# Patient Record
Sex: Male | Born: 1980 | Race: Black or African American | Hispanic: No | Marital: Single | Smoking: Former smoker
Health system: Southern US, Community
[De-identification: ages and names within clinical notes are randomized; demographics above are authoritative.]

## PROBLEM LIST (undated history)

## (undated) DIAGNOSIS — I10 Essential (primary) hypertension: Secondary | ICD-10-CM

## (undated) DIAGNOSIS — R519 Headache, unspecified: Secondary | ICD-10-CM

## (undated) DIAGNOSIS — K219 Gastro-esophageal reflux disease without esophagitis: Secondary | ICD-10-CM

## (undated) DIAGNOSIS — I509 Heart failure, unspecified: Secondary | ICD-10-CM

## (undated) HISTORY — PX: PILONIDAL CYST EXCISION: SHX744

## (undated) HISTORY — DX: Headache, unspecified: R51.9

## (undated) HISTORY — DX: Essential (primary) hypertension: I10

## (undated) HISTORY — DX: Heart failure, unspecified: I50.9

## (undated) HISTORY — PX: KNEE ARTHROSCOPY: SUR90

## (undated) HISTORY — PX: ORIF FINGER / THUMB FRACTURE: SUR932

## (undated) HISTORY — PX: SHOULDER ARTHROSCOPY WITH LABRAL REPAIR: SHX5691

## (undated) HISTORY — DX: Gastro-esophageal reflux disease without esophagitis: K21.9

## (undated) HISTORY — PX: ACHILLES TENDON SURGERY: SHX542

---

## 2015-12-08 ENCOUNTER — Encounter: Payer: Self-pay | Admitting: Urgent Care

## 2015-12-08 ENCOUNTER — Emergency Department
Admission: EM | Admit: 2015-12-08 | Discharge: 2015-12-09 | Disposition: A | Discharge status: Home / Self Care | Payer: 59 | Attending: Emergency Medicine | Admitting: Emergency Medicine

## 2015-12-08 DIAGNOSIS — Y9389 Activity, other specified: Secondary | ICD-10-CM | POA: Insufficient documentation

## 2015-12-08 DIAGNOSIS — Y998 Other external cause status: Secondary | ICD-10-CM | POA: Diagnosis not present

## 2015-12-08 DIAGNOSIS — T781XXA Other adverse food reactions, not elsewhere classified, initial encounter: Secondary | ICD-10-CM | POA: Diagnosis not present

## 2015-12-08 DIAGNOSIS — T7840XA Allergy, unspecified, initial encounter: Secondary | ICD-10-CM

## 2015-12-08 DIAGNOSIS — Y9289 Other specified places as the place of occurrence of the external cause: Secondary | ICD-10-CM | POA: Insufficient documentation

## 2015-12-08 DIAGNOSIS — X58XXXA Exposure to other specified factors, initial encounter: Secondary | ICD-10-CM | POA: Insufficient documentation

## 2015-12-08 DIAGNOSIS — R1310 Dysphagia, unspecified: Secondary | ICD-10-CM | POA: Diagnosis not present

## 2015-12-08 DIAGNOSIS — R0989 Other specified symptoms and signs involving the circulatory and respiratory systems: Secondary | ICD-10-CM | POA: Diagnosis not present

## 2015-12-08 MED ORDER — FAMOTIDINE IN NACL 20-0.9 MG/50ML-% IV SOLN
20.0000 mg | Freq: Once | INTRAVENOUS | Status: AC
  Administered 2015-12-08: 20 mg via INTRAVENOUS
  Filled 2015-12-08: qty 50

## 2015-12-08 MED ORDER — DIPHENHYDRAMINE HCL 50 MG/ML IJ SOLN
50.0000 mg | Freq: Once | INTRAMUSCULAR | Status: AC
  Administered 2015-12-08: 50 mg via INTRAVENOUS
  Filled 2015-12-08: qty 1

## 2015-12-08 MED ORDER — EPINEPHRINE 0.3 MG/0.3ML IJ SOAJ: 0.3000 mg | Freq: Once | INTRAMUSCULAR | Status: DC

## 2015-12-08 MED ORDER — FAMOTIDINE 20 MG PO TABS: 20.0000 mg | ORAL_TABLET | Freq: Two times a day (BID) | ORAL | Status: DC

## 2015-12-08 MED ORDER — METHYLPREDNISOLONE SODIUM SUCC 125 MG IJ SOLR
125.0000 mg | Freq: Once | INTRAMUSCULAR | Status: AC
  Administered 2015-12-08: 125 mg via INTRAVENOUS
  Filled 2015-12-08: qty 2

## 2015-12-08 MED ORDER — PREDNISONE 20 MG PO TABS: 60.0000 mg | ORAL_TABLET | Freq: Every day | ORAL | Status: DC

## 2015-12-08 NOTE — ED Notes (Signed)
Patient presents with reports of  Fullness in his throat s/p eating crab cakes. Patient reports that he feels a "little bit" SOB. NAD noted in triage.

## 2015-12-08 NOTE — Discharge Instructions (Signed)
°  Return to the emergency room if you feel worse in any way. If you have a life-threatening allergic reaction use the EpiPen. Then call 911. Take the medications as prescribed. Follow up with an allergist. Do not eat seafood or anything else to which you are actually allergic.

## 2015-12-08 NOTE — ED Notes (Signed)
Pt reports eating crab cakes 30 minutes ago and since then has had issue with swallowing - Denies shortness of breath - Pt reports sharp pain in sternum - No rash present

## 2015-12-08 NOTE — ED Provider Notes (Addendum)
[facility] Emergency Department Provider Note  ____________________________________________   I have reviewed the triage vital signs and the nursing notes.   HISTORY  Chief Complaint Allergic Reaction    HPI Kevin Morris is a 35 y.o. male with a history of seafood allergy. He states that sometimes he has allergic reactions and sometimes he doesn't sore just tried out every once in a while. He states that he had crab cakes shortly before coming in and noticed a slight tightness to his throat. No difficulty swallowing or breathing or talking. No hives. To me he denies shortness of breath. Patient has no tongue or lip swelling. No angioedema. Just a sensation that it is slightly difficult to swallow.  History reviewed. No pertinent past medical history.  There are no active problems to display for this patient.   No past surgical history on file.  No current outpatient prescriptions on file.  Allergies Review of patient's allergies indicates no known allergies.  No family history on file.  Social History Social History  Substance Use Topics  . Smoking status: Never Smoker   . Smokeless tobacco: None  . Alcohol Use: Yes    Review of Systems Constitutional: No fever/chills Eyes: No visual changes. ENT: No sore throat. No stiff neck no neck pain Cardiovascular: Denies chest pain. Respiratory: Denies shortness of breath. Gastrointestinal:   no vomiting.  No diarrhea.  No constipation. Genitourinary: Negative for dysuria. Musculoskeletal: Negative lower extremity swelling Skin: Negative for rash. Neurological: Negative for headaches, focal weakness or numbness. 10-point ROS otherwise negative.  ____________________________________________   PHYSICAL EXAM:  VITAL SIGNS: ED Triage Vitals  Enc Vitals Group     BP 12/08/15 2158 185/100 mmHg     Pulse Rate 12/08/15 2156 80     Resp 12/08/15 2156 18     Temp 12/08/15 2156 98.2 F (36.8 C)      Temp Source 12/08/15 2156 Oral     SpO2 12/08/15 2156 97 %     Weight 12/08/15 2156 300 lb (136.079 kg)     Height 12/08/15 2156 6\' 4"  (1.93 m)     Head Cir --      Peak Flow --      Pain Score 12/08/15 2157 0     Pain Loc --      Pain Edu? --      Excl. in GC? --     Constitutional: Alert and oriented. Well appearing and in no acute distress. Eyes: Conjunctivae are normal. PERRL. EOMI. Head: Atraumatic. Nose: No congestion/rhinnorhea. Mouth/Throat: Mucous membranes are moist.  Oropharynx non-erythematous. Neck: No stridor.   Nontender with no meningismus Cardiovascular: Normal rate, regular rhythm. Grossly normal heart sounds.  Good peripheral circulation. Respiratory: Normal respiratory effort.  No retractions. Lungs CTAB. Abdominal: Soft and nontender. No distention. No guarding no rebound Back:  There is no focal tenderness or step off there is no midline tenderness there are no lesions noted. there is no CVA tenderness Musculoskeletal: No lower extremity tenderness. No joint effusions, no DVT signs strong distal pulses no edema Neurologic:  Normal speech and language. No gross focal neurologic deficits are appreciated.  Skin:  Skin is warm, dry and intact. No rash noted. Psychiatric: Mood and affect are normal. Speech and behavior are normal.  ____________________________________________   LABS (all labs ordered are listed, but only abnormal results are displayed)  Labs Reviewed - No data to display ____________________________________________  EKG  I personally interpreted any EKGs ordered by me or triage  ____________________________________________  RADIOLOGY  I reviewed any imaging ordered by me or triage that were performed during my shift ____________________________________________   PROCEDURES  Procedure(s) performed: None  Critical Care performed: None  ____________________________________________   INITIAL IMPRESSION / ASSESSMENT AND PLAN /  ED COURSE  Pertinent labs & imaging results that were available during my care of the patient were reviewed by me and considered in my medical decision making (see chart for details).  Patient very well-appearing, or face is clear with no evidence of angioedema. However he does have some subjective symptoms after eating something to which she knows he is allergic. No evidence of anaphylaxis. We will give him our usual course of medication do not think EpiPen is required right now and we will observe him closely.Marland Kitchen  ----------------------------------------- 11:52 PM on 12/08/2015 -----------------------------------------  he states his symptoms are better. His physical exam remains unremarkable with no evidence of anaphylaxis. Blood pressures down to the 140s as he relaxes. He was quite anxious when he came in. We will observe him in the department for a total of 3 hours and if symptoms do not worsen we will discharge him sign out to Dr. York Cerise at the end of my shift. ____________________________________________   FINAL CLINICAL IMPRESSION(S) / ED DIAGNOSES  Final diagnoses:  None      This chart was dictated using voice recognition software.  Despite best efforts to proofread,  errors can occur which can change meaning.     Jeanmarie Plant, MD 12/08/15 2219  Jeanmarie Plant, MD 12/08/15 [phone]

## 2015-12-09 NOTE — ED Provider Notes (Signed)
-----------------------------------------   12:00 AM on 12/09/2015 -----------------------------------------   Blood pressure 151/72, pulse 72, temperature 98.2 F (36.8 C), temperature source Oral, resp. rate 20, height 6\' 4"  (1.93 m), weight 136.079 kg, SpO2 98 %.  Assuming care from Dr. Alphonzo LemmingsMcShane.  In short, Kevin Morris is a 35 y.o. male with a chief complaint of Allergic Reaction .  Refer to the original H&P for additional details.  The current plan of care is to reassess.  ----------------------------------------- 1:02 AM on 12/09/2015 -----------------------------------------  Patient states he is ready to go.  I advised him that we usually a lot of people longer than that after an allergic reaction but he is ready to leave.  Since he did Not meet criteria for anaphylaxis I think this is acceptable, and Dr. Alphonzo LemmingsMcShane felt that the patient could go when he ready.  I discharged him with Dr. Iline OvenMcShane's prescriptions and discharge instructions.  Loleta Roseory Nicoli Nardozzi, MD 12/09/15 [phone]

## 2021-08-29 ENCOUNTER — Inpatient Hospital Stay
Admission: EM | Admit: 2021-08-29 | Discharge: 2021-08-31 | DRG: 291 | Disposition: A | Discharge status: Home / Self Care | Payer: Managed Care, Other (non HMO) | Attending: Internal Medicine | Admitting: Internal Medicine

## 2021-08-29 ENCOUNTER — Emergency Department: Payer: Managed Care, Other (non HMO)

## 2021-08-29 ENCOUNTER — Other Ambulatory Visit: Payer: Self-pay

## 2021-08-29 ENCOUNTER — Encounter: Payer: Self-pay | Admitting: Emergency Medicine

## 2021-08-29 ENCOUNTER — Ambulatory Visit
Admission: EM | Admit: 2021-08-29 | Discharge: 2021-08-29 | Disposition: A | Discharge status: Home / Self Care | Payer: Managed Care, Other (non HMO)

## 2021-08-29 DIAGNOSIS — Z79899 Other long term (current) drug therapy: Secondary | ICD-10-CM

## 2021-08-29 DIAGNOSIS — I5033 Acute on chronic diastolic (congestive) heart failure: Secondary | ICD-10-CM | POA: Diagnosis present

## 2021-08-29 DIAGNOSIS — I161 Hypertensive emergency: Secondary | ICD-10-CM | POA: Diagnosis present

## 2021-08-29 DIAGNOSIS — I5043 Acute on chronic combined systolic (congestive) and diastolic (congestive) heart failure: Secondary | ICD-10-CM | POA: Diagnosis present

## 2021-08-29 DIAGNOSIS — R778 Other specified abnormalities of plasma proteins: Secondary | ICD-10-CM | POA: Diagnosis present

## 2021-08-29 DIAGNOSIS — I110 Hypertensive heart disease with heart failure: Principal | ICD-10-CM | POA: Diagnosis present

## 2021-08-29 DIAGNOSIS — I248 Other forms of acute ischemic heart disease: Secondary | ICD-10-CM | POA: Diagnosis present

## 2021-08-29 DIAGNOSIS — Z91041 Radiographic dye allergy status: Secondary | ICD-10-CM | POA: Diagnosis not present

## 2021-08-29 DIAGNOSIS — I5032 Chronic diastolic (congestive) heart failure: Secondary | ICD-10-CM | POA: Diagnosis not present

## 2021-08-29 DIAGNOSIS — Z91013 Allergy to seafood: Secondary | ICD-10-CM

## 2021-08-29 DIAGNOSIS — I160 Hypertensive urgency: Secondary | ICD-10-CM

## 2021-08-29 DIAGNOSIS — Z6841 Body Mass Index (BMI) 40.0 and over, adult: Secondary | ICD-10-CM

## 2021-08-29 DIAGNOSIS — R112 Nausea with vomiting, unspecified: Secondary | ICD-10-CM | POA: Diagnosis not present

## 2021-08-29 DIAGNOSIS — R0602 Shortness of breath: Secondary | ICD-10-CM | POA: Diagnosis not present

## 2021-08-29 DIAGNOSIS — I10 Essential (primary) hypertension: Secondary | ICD-10-CM | POA: Diagnosis present

## 2021-08-29 DIAGNOSIS — Z20822 Contact with and (suspected) exposure to covid-19: Secondary | ICD-10-CM | POA: Diagnosis present

## 2021-08-29 DIAGNOSIS — I509 Heart failure, unspecified: Secondary | ICD-10-CM

## 2021-08-29 DIAGNOSIS — I5021 Acute systolic (congestive) heart failure: Secondary | ICD-10-CM | POA: Diagnosis not present

## 2021-08-29 DIAGNOSIS — Z0189 Encounter for other specified special examinations: Secondary | ICD-10-CM

## 2021-08-29 LAB — URINE DRUG SCREEN, QUALITATIVE (ARMC ONLY)
Amphetamines, Ur Screen: NOT DETECTED
Barbiturates, Ur Screen: NOT DETECTED
Benzodiazepine, Ur Scrn: NOT DETECTED
Cannabinoid 50 Ng, Ur ~~LOC~~: NOT DETECTED
Cocaine Metabolite,Ur ~~LOC~~: NOT DETECTED
MDMA (Ecstasy)Ur Screen: NOT DETECTED
Methadone Scn, Ur: NOT DETECTED
Opiate, Ur Screen: NOT DETECTED
Phencyclidine (PCP) Ur S: NOT DETECTED
Tricyclic, Ur Screen: NOT DETECTED

## 2021-08-29 LAB — CBC
HCT: 42.8 % (ref 39.0–52.0)
Hemoglobin: 15.4 g/dL (ref 13.0–17.0)
MCH: 31.3 pg (ref 26.0–34.0)
MCHC: 36 g/dL (ref 30.0–36.0)
MCV: 87 fL (ref 80.0–100.0)
Platelets: 265 10*3/uL (ref 150–400)
RBC: 4.92 MIL/uL (ref 4.22–5.81)
RDW: 12.4 % (ref 11.5–15.5)
WBC: 6.1 10*3/uL (ref 4.0–10.5)
nRBC: 0 % (ref 0.0–0.2)

## 2021-08-29 LAB — BASIC METABOLIC PANEL
Anion gap: 7 (ref 5–15)
BUN: 10 mg/dL (ref 6–20)
CO2: 25 mmol/L (ref 22–32)
Calcium: 9.1 mg/dL (ref 8.9–10.3)
Chloride: 103 mmol/L (ref 98–111)
Creatinine, Ser: 1.08 mg/dL (ref 0.61–1.24)
GFR, Estimated: >60 mL/min (ref >60)
Glucose, Bld: 99 mg/dL (ref 70–99)
Potassium: 3.9 mmol/L (ref 3.5–5.1)
Sodium: 135 mmol/L (ref 135–145)

## 2021-08-29 LAB — HEMOGLOBIN A1C
Hgb A1c MFr Bld: 5.5 % (ref 4.8–5.6)
Mean Plasma Glucose: 111.15 mg/dL

## 2021-08-29 LAB — HEPATIC FUNCTION PANEL
ALT: 47 U/L — ABNORMAL HIGH (ref 0–44)
AST: 35 U/L (ref 15–41)
Albumin: 4.4 g/dL (ref 3.5–5.0)
Alkaline Phosphatase: 55 U/L (ref 38–126)
Bilirubin, Direct: 0.1 mg/dL (ref 0.0–0.2)
Indirect Bilirubin: 1.1 mg/dL — ABNORMAL HIGH (ref 0.3–0.9)
Total Bilirubin: 1.2 mg/dL (ref 0.3–1.2)
Total Protein: 7.7 g/dL (ref 6.5–8.1)

## 2021-08-29 LAB — TROPONIN I (HIGH SENSITIVITY)
Troponin I (High Sensitivity): 52 ng/L — ABNORMAL HIGH (ref <18)
Troponin I (High Sensitivity): 58 ng/L — ABNORMAL HIGH (ref <18)

## 2021-08-29 LAB — TSH: TSH: 2.219 u[IU]/mL (ref 0.350–4.500)

## 2021-08-29 LAB — T4, FREE: Free T4: 0.89 ng/dL (ref 0.61–1.12)

## 2021-08-29 LAB — CREATININE, SERUM
Creatinine, Ser: 1.06 mg/dL (ref 0.61–1.24)
GFR, Estimated: >60 mL/min (ref >60)

## 2021-08-29 LAB — MAGNESIUM: Magnesium: 1.8 mg/dL (ref 1.7–2.4)

## 2021-08-29 LAB — HIV ANTIBODY (ROUTINE TESTING W REFLEX): HIV Screen 4th Generation wRfx: NONREACTIVE

## 2021-08-29 LAB — BRAIN NATRIURETIC PEPTIDE: B Natriuretic Peptide: 251.6 pg/mL — ABNORMAL HIGH (ref 0.0–100.0)

## 2021-08-29 MED ORDER — ACETAMINOPHEN 325 MG PO TABS: 650.0000 mg | ORAL_TABLET | Freq: Four times a day (QID) | ORAL | Status: DC | PRN

## 2021-08-29 MED ORDER — DOCUSATE SODIUM 100 MG PO CAPS
100.0000 mg | ORAL_CAPSULE | Freq: Two times a day (BID) | ORAL | Status: DC
  Administered 2021-08-29 – 2021-08-30 (×3): 100 mg via ORAL
  Filled 2021-08-29 (×4): qty 1

## 2021-08-29 MED ORDER — HYDRALAZINE HCL 50 MG PO TABS
25.0000 mg | ORAL_TABLET | Freq: Three times a day (TID) | ORAL | Status: DC
  Administered 2021-08-29 – 2021-08-30 (×2): 25 mg via ORAL
  Filled 2021-08-29 (×2): qty 1

## 2021-08-29 MED ORDER — BISACODYL 5 MG PO TBEC: 5.0000 mg | DELAYED_RELEASE_TABLET | Freq: Every day | ORAL | Status: DC | PRN

## 2021-08-29 MED ORDER — ACETAMINOPHEN 325 MG RE SUPP: 650.0000 mg | Freq: Four times a day (QID) | RECTAL | Status: DC | PRN

## 2021-08-29 MED ORDER — HYDRALAZINE HCL 20 MG/ML IJ SOLN
5.0000 mg | INTRAMUSCULAR | Status: DC | PRN
  Administered 2021-08-29 – 2021-08-30 (×3): 5 mg via INTRAVENOUS
  Filled 2021-08-29 (×3): qty 1

## 2021-08-29 MED ORDER — ASPIRIN 81 MG PO CHEW
324.0000 mg | CHEWABLE_TABLET | Freq: Once | ORAL | Status: AC
  Administered 2021-08-29: 324 mg via ORAL
  Filled 2021-08-29: qty 4

## 2021-08-29 MED ORDER — SODIUM CHLORIDE 0.9% FLUSH: 3.0000 mL | Freq: Two times a day (BID) | INTRAVENOUS | Status: DC

## 2021-08-29 MED ORDER — LABETALOL HCL 5 MG/ML IV SOLN
10.0000 mg | Freq: Once | INTRAVENOUS | Status: AC
  Administered 2021-08-29: 10 mg via INTRAVENOUS
  Filled 2021-08-29: qty 4

## 2021-08-29 MED ORDER — FUROSEMIDE 10 MG/ML IJ SOLN
20.0000 mg | Freq: Two times a day (BID) | INTRAMUSCULAR | Status: DC
  Administered 2021-08-30: 20 mg via INTRAVENOUS
  Filled 2021-08-29: qty 4

## 2021-08-29 MED ORDER — FUROSEMIDE 10 MG/ML IJ SOLN
20.0000 mg | Freq: Once | INTRAMUSCULAR | Status: AC
  Administered 2021-08-29: 20 mg via INTRAVENOUS
  Filled 2021-08-29: qty 4

## 2021-08-29 MED ORDER — OXYCODONE HCL 5 MG PO TABS
5.0000 mg | ORAL_TABLET | ORAL | Status: DC | PRN
  Administered 2021-08-30 (×2): 5 mg via ORAL
  Filled 2021-08-29 (×2): qty 1

## 2021-08-29 MED ORDER — MORPHINE SULFATE (PF) 2 MG/ML IV SOLN: 2.0000 mg | INTRAVENOUS | Status: DC | PRN

## 2021-08-29 MED ORDER — POLYETHYLENE GLYCOL 3350 17 G PO PACK: 17.0000 g | PACK | Freq: Every day | ORAL | Status: DC | PRN

## 2021-08-29 MED ORDER — ONDANSETRON HCL 4 MG/2ML IJ SOLN
4.0000 mg | Freq: Four times a day (QID) | INTRAMUSCULAR | Status: DC | PRN
  Administered 2021-08-30: 4 mg via INTRAVENOUS
  Filled 2021-08-29: qty 2

## 2021-08-29 MED ORDER — NICOTINE 14 MG/24HR TD PT24: 14.0000 mg | MEDICATED_PATCH | Freq: Every day | TRANSDERMAL | Status: DC

## 2021-08-29 MED ORDER — ONDANSETRON HCL 4 MG PO TABS: 4.0000 mg | ORAL_TABLET | Freq: Four times a day (QID) | ORAL | Status: DC | PRN

## 2021-08-29 MED ORDER — HEPARIN SODIUM (PORCINE) 5000 UNIT/ML IJ SOLN
5000.0000 [IU] | Freq: Three times a day (TID) | INTRAMUSCULAR | Status: DC
  Administered 2021-08-30: 5000 [IU] via SUBCUTANEOUS
  Filled 2021-08-29 (×2): qty 1

## 2021-08-29 NOTE — ED Triage Notes (Signed)
Pt reports intermittent SOB and HA for the past 3 months. Pt states he has always been an athlete but now gets SOB with walking when he never did before.

## 2021-08-29 NOTE — ED Provider Notes (Signed)
Emergency Medicine Provider Triage Evaluation Note  Kevin Morris, a 40 y.o. male  was evaluated in triage.  Pt complains of intermittent shortness of breath today for the last 3 months.  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  He reports short walks from one room to the other will create some shortness of breath for him.  He denies any frank chest pain, headache, nausea, vomiting, or dizziness.  Review of Systems  Positive: SOB, HA Negative: CP, FCS  Physical Exam  Ht 6\' 4"  (1.93 m)   Wt (!) 137 kg   BMI 36.76 kg/m  Gen:   Awake, no distress  NAD Resp:  Normal effort CTA MSK:   Moves extremities without difficulty  Other:  CVS: RRR  Medical Decision Making  Medically screening exam initiated at 1:19 PM.  Appropriate orders placed.  Kevin Morris was informed that the remainder of the evaluation will be completed by another provider, this initial triage assessment does not replace that evaluation, and the importance of remaining in the ED until their evaluation is complete.  Patient to the ED for evaluation of 3 months of intermittent exertional shortness of breath and headache.   Marisa Severin, PA-C 08/29/21 1320    08/31/21, MD 08/29/21 1455

## 2021-08-29 NOTE — ED Notes (Signed)
Hospitalist at bedside 

## 2021-08-29 NOTE — ED Provider Notes (Signed)
[facility] Emergency Department Provider Note  ____________________________________________   Event Date/Time   First MD Initiated Contact with Patient 08/29/21 1712     (approximate)  I have reviewed the triage vital signs and the nursing notes.   HISTORY  Chief Complaint Shortness of Breath and Headache    HPI Kevin Morris is a 40 y.o. male  here with SOB, DOE. Pt reports that over the last several months, he has had progressively worsening fatigue, shortness of breath, and intermittent dyspnea. He states he has noticed that while he is normally fairly active and able to exercise, as he is a former Building surveyor, he has gone to becoming winded with even walking up steps. He's noticed orthopnea and PND as well, in addition to intermittent leg swelling. He reports that his sx have been progressively worsening. He has had some mild intermittent chest pressure w/ exertion as well. No CP at rest. He has a family h/o HTN, no known personal history though he was told in July at an unrelated visit after an accident that his BP was high. No current Cp at rest.         History reviewed. No pertinent past medical history.  Patient Active Problem List   Diagnosis Date Noted   SOB (shortness of breath) 08/29/2021    History reviewed. No pertinent surgical history.  Prior to Admission medications   Medication Sig Start Date End Date Taking? Authorizing Provider  EPINEPHrine 0.3 mg/0.3 mL IJ SOAJ injection Inject 0.3 mLs (0.3 mg total) into the muscle once. 12/08/15   Jeanmarie Plant, MD  famotidine (PEPCID) 20 MG tablet Take 1 tablet (20 mg total) by mouth 2 (two) times daily. 12/08/15 12/07/16  Jeanmarie Plant, MD  predniSONE (DELTASONE) 20 MG tablet Take 3 tablets (60 mg total) by mouth daily. 12/08/15   Jeanmarie Plant, MD    Allergies Patient has no known allergies.  Family History  Family history unknown: Yes    Social History Social History    Tobacco Use   Smoking status: Never  Substance Use Topics   Alcohol use: Yes    Review of Systems  Review of Systems  Constitutional:  Positive for fatigue. Negative for chills and fever.  HENT:  Negative for sore throat.   Respiratory:  Positive for shortness of breath.   Cardiovascular:  Positive for leg swelling. Negative for chest pain.  Gastrointestinal:  Negative for abdominal pain.  Genitourinary:  Negative for flank pain.  Musculoskeletal:  Negative for neck pain.  Skin:  Negative for rash and wound.  Allergic/Immunologic: Negative for immunocompromised state.  Neurological:  Negative for weakness and numbness.  Hematological:  Does not bruise/bleed easily.  All other systems reviewed and are negative.   ____________________________________________  PHYSICAL EXAM:      VITAL SIGNS: ED Triage Vitals  Enc Vitals Group     BP 08/29/21 1315 (!) 193/110     Pulse Rate 08/29/21 1315 72     Resp 08/29/21 1315 16     Temp 08/29/21 1315 97.9 F (36.6 C)     Temp Source 08/29/21 1315 Oral     SpO2 08/29/21 1315 97 %     Weight 08/29/21 1247 (!) 302 lb 0.5 oz (137 kg)     Height 08/29/21 1247 6\' 4"  (1.93 m)     Head Circumference --      Peak Flow --      Pain Score 08/29/21 1249 7  Pain Loc --      Pain Edu? --      Excl. in GC? --      Physical Exam Vitals and nursing note reviewed.  Constitutional:      General: He is not in acute distress.    Appearance: He is well-developed.  HENT:     Head: Normocephalic and atraumatic.  Eyes:     Conjunctiva/sclera: Conjunctivae normal.  Cardiovascular:     Rate and Rhythm: Normal rate and regular rhythm.     Heart sounds: Normal heart sounds. No murmur heard.   No friction rub.  Pulmonary:     Effort: Pulmonary effort is normal. No respiratory distress.     Breath sounds: Examination of the right-lower field reveals rales. Examination of the left-lower field reveals rales. Rales present. No wheezing.   Abdominal:     General: There is no distension.     Palpations: Abdomen is soft.     Tenderness: There is no abdominal tenderness.  Musculoskeletal:     Cervical back: Neck supple.     Right lower leg: Edema (trace) present.     Left lower leg: Edema (trace) present.  Skin:    General: Skin is warm.     Capillary Refill: Capillary refill takes less than 2 seconds.  Neurological:     Mental Status: He is alert and oriented to person, place, and time.     Motor: No abnormal muscle tone.      ____________________________________________   LABS (all labs ordered are listed, but only abnormal results are displayed)  Labs Reviewed  BRAIN NATRIURETIC PEPTIDE - Abnormal; Notable for the following components:      Result Value   B Natriuretic Peptide 251.6 (*)    All other components within normal limits  HEPATIC FUNCTION PANEL - Abnormal; Notable for the following components:   ALT 47 (*)    Indirect Bilirubin 1.1 (*)    All other components within normal limits  TROPONIN I (HIGH SENSITIVITY) - Abnormal; Notable for the following components:   Troponin I (High Sensitivity) 52 (*)    All other components within normal limits  TROPONIN I (HIGH SENSITIVITY) - Abnormal; Notable for the following components:   Troponin I (High Sensitivity) 58 (*)    All other components within normal limits  BASIC METABOLIC PANEL  CBC  URINE DRUG SCREEN, QUALITATIVE ([facility] ONLY)  HIV ANTIBODY (ROUTINE TESTING W REFLEX)  BASIC METABOLIC PANEL  CBC  CREATININE, SERUM  HEMOGLOBIN A1C  T4, FREE  TSH  MAGNESIUM    ____________________________________________  EKG: Normal sinus rhythm, VR 69. PR 170, QRS 96, QTc 462. TWI noted in inferior and lateral precordial leads. LVH pattern. No ST elevations. ________________________________________  RADIOLOGY All imaging, including plain films, CT scans, and ultrasounds, independently reviewed by me, and interpretations confirmed via formal radiology  reads.  ED MD interpretation:   CXR: Cardiomegaly  Official radiology report(s): DG Chest 2 View  Result Date: 08/29/2021 CLINICAL DATA:  Shortness of breath. EXAM: CHEST - 2 VIEW COMPARISON:  None. FINDINGS: Mild cardiomegaly. Normal mediastinal contours. Normal pulmonary vascularity. No focal consolidation, pleural effusion, or pneumothorax. No acute osseous abnormality. IMPRESSION: 1. Mild cardiomegaly. Electronically Signed   By: Obie Dredge M.D.   On: 08/29/2021 13:14    ____________________________________________  PROCEDURES   Procedure(s) performed (including Critical Care):  Procedures  ____________________________________________  INITIAL IMPRESSION / MDM / ASSESSMENT AND PLAN / ED COURSE  As part of my medical decision making, I  reviewed the following data within the electronic MEDICAL RECORD NUMBER Nursing notes reviewed and incorporated, Old chart reviewed, Notes from prior ED visits, and [place] Controlled Substance Database       *Kevin Morris was evaluated in Emergency Department on 08/29/2021 for the symptoms described in the history of present illness. He was evaluated in the context of the global COVID-19 pandemic, which necessitated consideration that the patient might be at risk for infection with the SARS-CoV-2 virus that causes COVID-19. Institutional protocols and algorithms that pertain to the evaluation of patients at risk for COVID-19 are in a state of rapid change based on information released by regulatory bodies including the CDC and federal and state organizations. These policies and algorithms were followed during the patient's care in the ED.  Some ED evaluations and interventions may be delayed as a result of limited staffing during the pandemic.*     Medical Decision Making:  40 yo M here with DOE, PND, intermittent CP. EKG shows LVH and possible demand. No St elevations. No CP at rest. Pt markedly hypertensive which improved with IV Labetalol. CXR  concerning for cardiomegaly and clinically, pt's sx are concerning for CHF. Trop elevated at 58. BNP >200. Will diurese, admit for new-onset CHF with HTN urgency.  ____________________________________________  FINAL CLINICAL IMPRESSION(S) / ED DIAGNOSES  Final diagnoses:  Hypertensive urgency  New onset of congestive heart failure (HCC)     MEDICATIONS GIVEN DURING THIS VISIT:  Medications  furosemide (LASIX) injection 20 mg (has no administration in time range)  sodium chloride flush (NS) 0.9 % injection 3 mL (has no administration in time range)  acetaminophen (TYLENOL) tablet 650 mg (has no administration in time range)    Or  acetaminophen (TYLENOL) suppository 650 mg (has no administration in time range)  oxyCODONE (Oxy IR/ROXICODONE) immediate release tablet 5 mg (has no administration in time range)  morphine 2 MG/ML injection 2 mg (has no administration in time range)  docusate sodium (COLACE) capsule 100 mg (has no administration in time range)  polyethylene glycol (MIRALAX / GLYCOLAX) packet 17 g (has no administration in time range)  bisacodyl (DULCOLAX) EC tablet 5 mg (has no administration in time range)  ondansetron (ZOFRAN) tablet 4 mg (has no administration in time range)    Or  ondansetron (ZOFRAN) injection 4 mg (has no administration in time range)  nicotine (NICODERM CQ - dosed in mg/24 hours) patch 14 mg (14 mg Transdermal Not Given 08/29/21 2015)  hydrALAZINE (APRESOLINE) injection 5 mg (has no administration in time range)  heparin injection 5,000 Units (has no administration in time range)  labetalol (NORMODYNE) injection 10 mg (10 mg Intravenous Given 08/29/21 1826)  aspirin chewable tablet 324 mg (324 mg Oral Given 08/29/21 1828)  furosemide (LASIX) injection 20 mg (20 mg Intravenous Given 08/29/21 1835)     ED Discharge Orders     None        Note:  This document was prepared using Dragon voice recognition software and may include unintentional  dictation errors.   Shaune Pollack, MD 08/29/21 2030

## 2021-08-29 NOTE — ED Triage Notes (Signed)
Pt called for VS, EKG, labs, no reposnse

## 2021-08-29 NOTE — Discharge Instructions (Addendum)
Patient is advised to go to the emergency room for further evaluation/work-up.  He may need IV medications for blood pressure control with close monitoring.

## 2021-08-29 NOTE — H&P (Addendum)
History and Physical    Josaih Zurcher [id number] DOB: [birth year] DOA: 08/29/2021  PCP: Pcp, No    Patient coming from:  Home   Chief Complaint:  SOB.   HPI:  Kevin Morris is a 40 y.o. male seen in ed with complaints of shortness of breath for the past 3 months.  Patient also reports dyspnea on exertion when he walks from one room to the other at home.  He reports generalized weakness.  He gets short winded when he is trying to go up steps.  Patient also reports orthopnea.  Mild intermittent chest pain with exertion as well. Review of systems is negative for headaches or vision speech or gait issues palpitations, abdominal pain chills or any urinary trouble.  Pt has no significant past medical history past medical history of diabetes, heart disease, high blood pressure.   ED Course: In the emergency room patient was hypertensive on arrival, and received IV labetalol which has brought down his  blood pressure. Vitals:   08/29/21 1648 08/29/21 1651 08/29/21 1832 08/29/21 1833  BP:  (!) 180/120 (!) 168/108 (!) 159/103  Pulse: 77 72 69 69  Resp: 17 17 16    Temp:      TempSrc:      SpO2: 96% 97% 100% 97%  Weight:      Height:      Oxygen saturation is 96% on room air.  Patient has no history of alcohol drugs or smoking. Blood work in the emergency room showed CBC that is normal except for mild ALT elevation of 47, indirect bili of 1.1, BNP 251.6, troponin of 52 with a repeat of 58, CBC is within normal limits, Chest x-ray shows mild cardiomegaly, the emergency room patient received Lasix 20 mg, labetalol 10 mg, aspirin 324 COVID and urine drug screen are pending.   Review of Systems:  Review of Systems  Constitutional: Negative.   HENT: Negative.    Respiratory:  Positive for shortness of breath.   Cardiovascular:  Positive for chest pain.  Gastrointestinal: Negative.   Genitourinary: Negative.   Musculoskeletal: Negative.   Skin: Negative.   Neurological:  Negative.   Psychiatric/Behavioral: Negative.      History reviewed. No pertinent past medical history.  History reviewed. No pertinent surgical history.   reports that he has never smoked. He does not have any smokeless tobacco history on file. He reports current alcohol use. No history on file for drug use.  No Known Allergies  Family History  Family history unknown: Yes    Prior to Admission medications   Medication Sig Start Date End Date Taking? Authorizing Provider  EPINEPHrine 0.3 mg/0.3 mL IJ SOAJ injection Inject 0.3 mLs (0.3 mg total) into the muscle once. 12/08/15   Schuyler Amor, MD  famotidine (PEPCID) 20 MG tablet Take 1 tablet (20 mg total) by mouth 2 (two) times daily. 12/08/15 12/07/16  Schuyler Amor, MD  predniSONE (DELTASONE) 20 MG tablet Take 3 tablets (60 mg total) by mouth daily. 12/08/15   Schuyler Amor, MD    Physical Exam: Vitals:   08/29/21 1648 08/29/21 1651 08/29/21 1832 08/29/21 1833  BP:  (!) 180/120 (!) 168/108 (!) 159/103  Pulse: 77 72 69 69  Resp: 17 17 16    Temp:      TempSrc:      SpO2: 96% 97% 100% 97%  Weight:      Height:       Physical Exam Constitutional:      Appearance: Normal  appearance. He is not ill-appearing.  HENT:     Head: Normocephalic and atraumatic.     Right Ear: External ear normal.     Left Ear: External ear normal.     Nose: Nose normal.     Mouth/Throat:     Mouth: Mucous membranes are moist.  Eyes:     Conjunctiva/sclera: Conjunctivae normal.  Neck:     Vascular: No carotid bruit.  Cardiovascular:     Rate and Rhythm: Normal rate and regular rhythm.     Pulses: Normal pulses.     Heart sounds: Normal heart sounds.  Pulmonary:     Breath sounds: Rales present. No wheezing.  Chest:     Chest wall: Tenderness present.  Abdominal:     General: There is no distension.     Palpations: There is no mass.     Tenderness: There is no abdominal tenderness. There is no guarding or rebound.     Hernia: No  hernia is present.  Musculoskeletal:     Cervical back: No rigidity.     Right lower leg: No edema.     Left lower leg: No edema.  Neurological:     Mental Status: He is alert.     Labs on Admission: I have personally reviewed following labs and imaging studies  No results for input(s): CKTOTAL, CKMB, TROPONINI in the last 72 hours. Lab Results  Component Value Date   WBC 6.1 08/29/2021   HGB 15.4 08/29/2021   HCT 42.8 08/29/2021   MCV 87.0 08/29/2021   PLT 265 08/29/2021    Recent Labs  Lab 08/29/21 1316  NA 135  K 3.9  CL 103  CO2 25  BUN 10  CREATININE 1.08  CALCIUM 9.1  PROT 7.7  BILITOT 1.2  ALKPHOS 55  ALT 47*  AST 35  GLUCOSE 99   No results found for: CHOL, HDL, LDLCALC, TRIG No results found for: DDIMER Invalid input(s): POCBNP   COVID-19 Labs No results for input(s): DDIMER, FERRITIN, LDH, CRP in the last 72 hours. No results found for: SARSCOV2NAA  Radiological Exams on Admission: DG Chest 2 View  Result Date: 08/29/2021 CLINICAL DATA:  Shortness of breath. EXAM: CHEST - 2 VIEW COMPARISON:  None. FINDINGS: Mild cardiomegaly. Normal mediastinal contours. Normal pulmonary vascularity. No focal consolidation, pleural effusion, or pneumothorax. No acute osseous abnormality. IMPRESSION: 1. Mild cardiomegaly. Electronically Signed   By: Obie Dredge M.D.   On: 08/29/2021 13:14    EKG: Independently reviewed.  EKG shows sinus rhythm at 69 PR interval of 178 and QTC of 462, patient does have T wave inversions in inferolateral leads and LVH.  Echocardiogram: Ordered and pending.    Assessment/Plan: Patient is a 40 year old African-American male coming to [place] with complaints of shortness of breath swelling and intermittent chest pain 3 episodes over the past few years.  Will admit to progressive care unit Principal Problem:   SOB (shortness of breath) Attribute shortness of breath to pulmonary vascular congestion secondary to hypertensive urgency.   Supportive care and supplemental oxygen.  Ambulatory pulse ox prior to discharge. Strict I's and O's, daily weights. Lasix 20 mg iv bid. Monitor output and intakes, Echo and Prn diruesis. Once euvolemic start meds and he also needs ischemic eval with ef  Hypertensive urgency: Pt given labetalol and we will start hydralazine sch and consider hctz or diltiazem based on echo findings.   Elevated troponin: Secondary to demand ischemia. Will repeat and if 3rd one is high  we will consider starting  nstemi protocol with anticoagulation.    DVT prophylaxis:  Heparin.  .  Code Status:  Full code  Family Communication:  Lyon, Abella (Mother)  [phone] (Mobile)   Disposition Plan:  Home    Consults called:  None   Admission status: Inpatient.     Para Skeans MD [facility] [phone] How to contact the [facility] Attending or Consulting provider [place] or covering provider during after hours [place], for this patient.    Check the care team in [facility] and look for a) attending/consulting [facility] provider listed and b) the [facility] team listed Log into [email or web address] and use [place]'s universal password to access. If you do not have the password, please contact the hospital operator. Locate the [facility] provider you are looking for under [facility] and page to a number that you can be directly reached. If you still have difficulty reaching the provider, please page the [facility] (Director on Call) for the Hospitalists listed on amion for assistance. [email or web address] Password [facility] 08/29/2021, 7:35 PM

## 2021-08-29 NOTE — ED Triage Notes (Signed)
Pt has had a very recent hx of HTN, but for the last 3 months has had episodes of SOB and intense head pressure. Last night the pressure was so great he could not sleep.

## 2021-08-29 NOTE — ED Notes (Signed)
Pt. Provided lunch tray and gingerale.

## 2021-08-30 ENCOUNTER — Inpatient Hospital Stay: Payer: Managed Care, Other (non HMO)

## 2021-08-30 ENCOUNTER — Inpatient Hospital Stay (HOSPITAL_COMMUNITY)
Admit: 2021-08-30 | Discharge: 2021-08-30 | Disposition: A | Discharge status: Home / Self Care | Payer: Managed Care, Other (non HMO) | Attending: Internal Medicine | Admitting: Internal Medicine

## 2021-08-30 DIAGNOSIS — I5032 Chronic diastolic (congestive) heart failure: Secondary | ICD-10-CM | POA: Diagnosis present

## 2021-08-30 DIAGNOSIS — I5021 Acute systolic (congestive) heart failure: Secondary | ICD-10-CM | POA: Diagnosis not present

## 2021-08-30 DIAGNOSIS — R112 Nausea with vomiting, unspecified: Secondary | ICD-10-CM | POA: Diagnosis present

## 2021-08-30 DIAGNOSIS — I10 Essential (primary) hypertension: Secondary | ICD-10-CM | POA: Diagnosis present

## 2021-08-30 DIAGNOSIS — I161 Hypertensive emergency: Secondary | ICD-10-CM | POA: Diagnosis present

## 2021-08-30 DIAGNOSIS — I248 Other forms of acute ischemic heart disease: Secondary | ICD-10-CM | POA: Diagnosis present

## 2021-08-30 DIAGNOSIS — Z0189 Encounter for other specified special examinations: Secondary | ICD-10-CM

## 2021-08-30 DIAGNOSIS — I5033 Acute on chronic diastolic (congestive) heart failure: Secondary | ICD-10-CM | POA: Diagnosis not present

## 2021-08-30 DIAGNOSIS — I2489 Other forms of acute ischemic heart disease: Secondary | ICD-10-CM | POA: Diagnosis present

## 2021-08-30 DIAGNOSIS — R778 Other specified abnormalities of plasma proteins: Secondary | ICD-10-CM | POA: Diagnosis present

## 2021-08-30 LAB — RESP PANEL BY RT-PCR (FLU A&B, COVID) ARPGX2
Influenza A by PCR: NEGATIVE
Influenza B by PCR: NEGATIVE
SARS Coronavirus 2 by RT PCR: NEGATIVE

## 2021-08-30 LAB — COMPREHENSIVE METABOLIC PANEL
ALT: 45 U/L — ABNORMAL HIGH (ref 0–44)
AST: 29 U/L (ref 15–41)
Albumin: 4.2 g/dL (ref 3.5–5.0)
Alkaline Phosphatase: 51 U/L (ref 38–126)
Anion gap: 8 (ref 5–15)
BUN: 9 mg/dL (ref 6–20)
CO2: 25 mmol/L (ref 22–32)
Calcium: 9.3 mg/dL (ref 8.9–10.3)
Chloride: 103 mmol/L (ref 98–111)
Creatinine, Ser: 1.05 mg/dL (ref 0.61–1.24)
GFR, Estimated: >60 mL/min (ref >60)
Glucose, Bld: 116 mg/dL — ABNORMAL HIGH (ref 70–99)
Potassium: 3.8 mmol/L (ref 3.5–5.1)
Sodium: 136 mmol/L (ref 135–145)
Total Bilirubin: 1.1 mg/dL (ref 0.3–1.2)
Total Protein: 7.4 g/dL (ref 6.5–8.1)

## 2021-08-30 LAB — CBC WITH DIFFERENTIAL/PLATELET
Abs Immature Granulocytes: 0.02 10*3/uL (ref 0.00–0.07)
Basophils Absolute: 0 10*3/uL (ref 0.0–0.1)
Basophils Relative: 1 %
Eosinophils Absolute: 0 10*3/uL (ref 0.0–0.5)
Eosinophils Relative: 1 %
HCT: 43 % (ref 39.0–52.0)
Hemoglobin: 15.3 g/dL (ref 13.0–17.0)
Immature Granulocytes: 0 %
Lymphocytes Relative: 35 %
Lymphs Abs: 1.9 10*3/uL (ref 0.7–4.0)
MCH: 30.5 pg (ref 26.0–34.0)
MCHC: 35.6 g/dL (ref 30.0–36.0)
MCV: 85.8 fL (ref 80.0–100.0)
Monocytes Absolute: 0.5 10*3/uL (ref 0.1–1.0)
Monocytes Relative: 8 %
Neutro Abs: 3 10*3/uL (ref 1.7–7.7)
Neutrophils Relative %: 55 %
Platelets: 251 10*3/uL (ref 150–400)
RBC: 5.01 MIL/uL (ref 4.22–5.81)
RDW: 12.6 % (ref 11.5–15.5)
WBC: 5.5 10*3/uL (ref 4.0–10.5)
nRBC: 0 % (ref 0.0–0.2)

## 2021-08-30 LAB — ECHOCARDIOGRAM COMPLETE
AR max vel: 2.94 cm2
AV Area VTI: 4.2 cm2
AV Area mean vel: 2.86 cm2
AV Mean grad: 2 mmHg
AV Peak grad: 4.2 mmHg
Ao pk vel: 1.02 m/s
Area-P 1/2: 5.16 cm2
Height: 76 in
S' Lateral: 3.1 cm
Weight: 5600 oz

## 2021-08-30 LAB — MAGNESIUM: Magnesium: 1.8 mg/dL (ref 1.7–2.4)

## 2021-08-30 LAB — TROPONIN I (HIGH SENSITIVITY): Troponin I (High Sensitivity): 50 ng/L — ABNORMAL HIGH (ref <18)

## 2021-08-30 LAB — CORTISOL-AM, BLOOD: Cortisol - AM: 5.9 ug/dL — ABNORMAL LOW (ref 6.7–22.6)

## 2021-08-30 MED ORDER — HYDRALAZINE HCL 50 MG PO TABS
50.0000 mg | ORAL_TABLET | Freq: Three times a day (TID) | ORAL | Status: DC
  Administered 2021-08-30 (×2): 50 mg via ORAL
  Filled 2021-08-30 (×2): qty 1

## 2021-08-30 MED ORDER — ACETAMINOPHEN 325 MG PO TABS
650.0000 mg | ORAL_TABLET | Freq: Four times a day (QID) | ORAL | Status: DC | PRN
  Administered 2021-08-31: 650 mg via ORAL
  Filled 2021-08-30 (×2): qty 2

## 2021-08-30 MED ORDER — BUTALBITAL-APAP-CAFFEINE 50-325-40 MG PO TABS: 1.0000 | ORAL_TABLET | Freq: Four times a day (QID) | ORAL | Status: DC | PRN

## 2021-08-30 MED ORDER — LABETALOL HCL 5 MG/ML IV SOLN
10.0000 mg | INTRAVENOUS | Status: DC | PRN
  Administered 2021-08-30 – 2021-08-31 (×3): 10 mg via INTRAVENOUS
  Filled 2021-08-30 (×3): qty 4

## 2021-08-30 MED ORDER — LOSARTAN POTASSIUM 50 MG PO TABS: 25.0000 mg | ORAL_TABLET | Freq: Every day | ORAL | Status: DC

## 2021-08-30 MED ORDER — FUROSEMIDE 10 MG/ML IJ SOLN
40.0000 mg | Freq: Every day | INTRAMUSCULAR | Status: DC
  Administered 2021-08-31: 40 mg via INTRAVENOUS
  Filled 2021-08-30: qty 4

## 2021-08-30 MED ORDER — ENOXAPARIN SODIUM 40 MG/0.4ML IJ SOSY
40.0000 mg | PREFILLED_SYRINGE | INTRAMUSCULAR | Status: DC
  Administered 2021-08-30: 40 mg via SUBCUTANEOUS
  Filled 2021-08-30: qty 0.4

## 2021-08-30 MED ORDER — METHOCARBAMOL 500 MG PO TABS
500.0000 mg | ORAL_TABLET | Freq: Four times a day (QID) | ORAL | Status: DC | PRN
  Administered 2021-08-30: 500 mg via ORAL
  Filled 2021-08-30 (×3): qty 1

## 2021-08-30 MED ORDER — LOSARTAN POTASSIUM 50 MG PO TABS: 50.0000 mg | ORAL_TABLET | Freq: Every day | ORAL | Status: DC

## 2021-08-30 MED ORDER — PANTOPRAZOLE SODIUM 40 MG PO TBEC
40.0000 mg | DELAYED_RELEASE_TABLET | Freq: Every day | ORAL | Status: DC
  Administered 2021-08-31: 40 mg via ORAL
  Filled 2021-08-30: qty 1

## 2021-08-30 MED ORDER — METOCLOPRAMIDE HCL 5 MG/ML IJ SOLN
10.0000 mg | Freq: Three times a day (TID) | INTRAMUSCULAR | Status: DC
  Administered 2021-08-30 – 2021-08-31 (×2): 10 mg via INTRAVENOUS
  Filled 2021-08-30 (×2): qty 2

## 2021-08-30 MED ORDER — HYDRALAZINE HCL 50 MG PO TABS
100.0000 mg | ORAL_TABLET | Freq: Three times a day (TID) | ORAL | Status: DC
  Administered 2021-08-30 – 2021-08-31 (×2): 100 mg via ORAL
  Filled 2021-08-30 (×2): qty 2

## 2021-08-30 MED ORDER — ENOXAPARIN SODIUM 80 MG/0.8ML IJ SOSY: 0.5000 mg/kg | PREFILLED_SYRINGE | INTRAMUSCULAR | Status: DC

## 2021-08-30 MED ORDER — ASPIRIN EC 81 MG PO TBEC
81.0000 mg | DELAYED_RELEASE_TABLET | Freq: Every day | ORAL | Status: DC
  Administered 2021-08-30 – 2021-08-31 (×2): 81 mg via ORAL
  Filled 2021-08-30 (×2): qty 1

## 2021-08-30 MED ORDER — HYDRALAZINE HCL 20 MG/ML IJ SOLN: 10.0000 mg | INTRAMUSCULAR | Status: DC | PRN

## 2021-08-30 MED ORDER — HYDRALAZINE HCL 20 MG/ML IJ SOLN
10.0000 mg | INTRAMUSCULAR | Status: DC | PRN
  Administered 2021-08-30: 10 mg via INTRAVENOUS
  Filled 2021-08-30: qty 1

## 2021-08-30 MED ORDER — FUROSEMIDE 10 MG/ML IJ SOLN: 40.0000 mg | Freq: Two times a day (BID) | INTRAMUSCULAR | Status: DC

## 2021-08-30 MED ORDER — AMLODIPINE BESYLATE 5 MG PO TABS
5.0000 mg | ORAL_TABLET | Freq: Every day | ORAL | Status: DC
  Administered 2021-08-30 – 2021-08-31 (×2): 5 mg via ORAL
  Filled 2021-08-30 (×2): qty 1

## 2021-08-30 NOTE — Assessment & Plan Note (Signed)
Mildly elevated troponin secondary to hypertensive emergency. Echocardiogram shows no valvular abnormality.  Patient does not have any chest pain.  Monitor.

## 2021-08-30 NOTE — Assessment & Plan Note (Signed)
Placing the patient at high risk for poor outcome. Patient may also have sleep apnea which is leading to hypertension. Recommend outpatient sleep study.

## 2021-08-30 NOTE — ED Notes (Signed)
Michael RN aware of assigned bed 

## 2021-08-30 NOTE — Assessment & Plan Note (Addendum)
Presents with complaints of shortness of breath, orthopnea and PND. Echocardiogram shows diastolic dysfunction with normal EF without any valvular abnormality. Chest x-ray shows cardiomegaly with mild vascular congestion. Currently on room air. Suspect this is combination of OSA and hypertensive heart failure. Gentle diuresis with IV Lasix.

## 2021-08-30 NOTE — Assessment & Plan Note (Addendum)
We will continue scheduled Reglan.  As needed Zofran. Also added PPI.  Check x-ray abdomen. Check CT head.

## 2021-08-30 NOTE — Progress Notes (Signed)
[facility] Progress Note  Patient: Kevin Morris    VOH:[id number]  DOA: 08/29/2021    Date of Service: the patient was seen and examined on 08/30/2021  Brief hospital course: 11/23 admitted to the hospital. 11/24 echocardiogram performed shows 55 to 60% EF, no WMA, LVH seen.  No valvular abnormality. Started to have nausea and vomiting in the afternoon.   Assessment and Plan: * Acute on chronic diastolic CHF (congestive heart failure) (HCC) Presents with complaints of shortness of breath, orthopnea and PND. Echocardiogram shows diastolic dysfunction with normal EF without any valvular abnormality. Chest x-ray shows cardiomegaly with mild vascular congestion. Currently on room air. Suspect this is combination of OSA and hypertensive heart failure. Gentle diuresis with IV Lasix.  Hypertensive emergency Blood pressure significantly elevated. Along with that the patient has some headache.  No blurry vision.  No focal deficit. Due to persistent hypertension with headache.  Will check CT scan of the head. Continue aggressive antihypertensive regimen with hydralazine increasing to 100 mg.  Add Norvasc.  As needed labetalol. I will also check other etiologies of secondary hypertension including plasma metanephrine, 24-hour urine metanephrine.  PRA PAC.  Once renal artery stenosis ruled out add losartan.  Demand ischemia (HCC) Mildly elevated troponin secondary to hypertensive emergency. Echocardiogram shows no valvular abnormality.  Patient does not have any chest pain.  Monitor.  Elevated troponin See demand ischemia.  Troponin flat but elevated.  Intractable nausea and vomiting We will continue scheduled Reglan.  As needed Zofran. Also added PPI.  Check x-ray abdomen. Check CT head.  Obesity, Class III, BMI 40-49.9 (morbid obesity) (HCC) Placing the patient at high risk for poor outcome. Patient may also have sleep apnea which is leading to hypertension. Recommend  outpatient sleep study.  Needs sleep apnea assessment Recommend outpatient sleep study.  Body mass index is 42.6 kg/m.        Subjective: Had some headache primarily in the frontal area.  No nausea or vomiting.  Later in the day developed nausea and vomiting.  No abdominal pain.  Reports orthopnea as well as PND.  No chest pain.  Objective: Vital signs were reviewed and unremarkable except for: Blood pressure: 200 systolic   Exam: General: Appear in mild distress, no Rash; Oral Mucosa Clear, moist. no Abnormal Neck Mass Or lumps, Conjunctiva normal  Cardiovascular: S1 and S2 Present, no Murmur, Respiratory: increased respiratory effort, Bilateral Air entry present and faint Crackles, no wheezes Abdomen: Bowel Sound present, Soft and no tenderness Extremities: trace Pedal edema Neurology: alert and oriented to time, place, and person affect appropriate. no new focal deficit Gait not checked due to patient safety concerns    Data Reviewed: My review of labs, imaging, notes and other tests is significant for Mild elevated troponin.    Disposition:  Status is: Inpatient  Remains inpatient appropriate because: Ongoing headache as well as severe hypertension with nausea and vomiting requiring inpatient therapy.   Family Communication: None at bedside.  DVT Prophylaxis:    Time spent: 35 minutes.   Author: Lynden Oxford  08/30/2021 7:11 PM  To reach On-call, see care teams to locate the attending and reach out via [email or web address]. Between 7PM-7AM, please contact night-coverage If you still have difficulty reaching the attending provider, please page the [facility] (Director on Call) for [facility] on amion for assistance.

## 2021-08-30 NOTE — Progress Notes (Signed)
Administered IV Hydralazine  B/P - 172/103

## 2021-08-30 NOTE — Hospital Course (Signed)
11/23 admitted to the hospital. 11/24 echocardiogram performed shows 55 to 60% EF, no WMA, LVH seen.  No valvular abnormality. Started to have nausea and vomiting in the afternoon.

## 2021-08-30 NOTE — ED Notes (Signed)
Pt vomiting at this time. Zofran given per PRN order.

## 2021-08-30 NOTE — Progress Notes (Signed)
Earlier in the shift, patient had elevated blood pressure of 185/110. Administered IV hydralazine per PRN order. Blood pressure came down to about 160/90 but went back up to 170's/ 120's. By this time patient states has a headache, describes it as pressure and a little achy.  Administered prn Oxycodone and oral hydralazine to assist with both pain and blood pressure. Will continue to monitor to end of shift.

## 2021-08-30 NOTE — Progress Notes (Signed)
PHARMACIST - PHYSICIAN COMMUNICATION  CONCERNING:  Enoxaparin (Lovenox) for DVT Prophylaxis    RECOMMENDATION: Patient was prescribed enoxaprin 40mg  q24 hours for VTE prophylaxis.   Filed Weights   08/29/21 1247 08/30/21 0500  Weight: (!) 137 kg (302 lb 0.5 oz) (!) 158.8 kg (350 lb)    Body mass index is 42.6 kg/m.  Estimated Creatinine Clearance: 152.9 mL/min (by C-G formula based on SCr of 1.05 mg/dL).   Based on [facility] policy patient is candidate for enoxaparin 0.5mg /kg TBW SQ every 24 hours based on BMI being >30.  Patient is candidate for enoxaparin 30mg  every 24 hours based on CrCl <75ml/min or Weight <45kg  DESCRIPTION: Pharmacy has adjusted enoxaparin dose per [facility] policy.  Patient is now receiving enoxaparin 80 mg every 24 hours    31m, PharmD Clinical Pharmacist  08/30/2021 2:42 PM

## 2021-08-30 NOTE — TOC Initial Note (Signed)
Transition of Care [facility]) - Initial/Assessment Note    Patient Details  Name: Kevin Morris MRN: [id number] Date of Birth: Feb 23, 1981  Transition of Care [facility]) CM/SW Contact:    Kevin Morris Phone Number: [phone] 08/30/2021, 10:37 AM  Clinical Narrative:                  Patient presents to [facility] due to increasing SOB and worsening fatigue for the last three months. Patient is independent with all ADLs. Patient's main contact Kevin Morris, Kevin Morris (Mother) [phone].   Expected Discharge Plan: Home/Self Care Barriers to Discharge: Continued Medical Work up   Patient Goals and CMS Choice        Expected Discharge Plan and Services Expected Discharge Plan: Home/Self Care In-house Referral: Clinical Social Work     Living arrangements for the past 2 months: Single Family Home                                      Prior Living Arrangements/Services Living arrangements for the past 2 months: Single Family Home Lives with:: Self Patient language and need for interpreter reviewed:: Yes Do you feel safe going back to the place where you live?: Yes      Need for Family Participation in Patient Care: Yes (Comment) Care giver support system in place?: Yes (comment)   Criminal Activity/Legal Involvement Pertinent to Current Situation/Hospitalization: No - Comment as needed  Activities of Daily Living      Permission Sought/Granted Permission sought to share information with : Family Supports    Share Information with NAME: Kevin Morris, Kevin Morris (Mother)   [phone] (Mobile)           Emotional Assessment Appearance:: Appears stated age Attitude/Demeanor/Rapport: Engaged Affect (typically observed): Stoic Orientation: : Oriented to Self, Oriented to Place, Oriented to  Time, Oriented to Situation Alcohol / Substance Use: Not Applicable, Alcohol Use Psych Involvement: No (comment)  Admission diagnosis:  SOB (shortness of breath) [R06.02] Patient  Active Problem List   Diagnosis Date Noted   SOB (shortness of breath) 08/29/2021   Kevin Morris:  Kevin Morris, No Pharmacy:   [id number] - [place], [place] - [address] AT [facility] [address] [place] [address] Phone: [phone] Fax: [phone]     Social Determinants of Health (SDOH) Interventions    Readmission Risk Interventions No flowsheet data found.

## 2021-08-30 NOTE — Assessment & Plan Note (Addendum)
Blood pressure significantly elevated. Along with that the patient has some headache.  No blurry vision.  No focal deficit. Due to persistent hypertension with headache.  Will check CT scan of the head. Continue aggressive antihypertensive regimen with hydralazine increasing to 100 mg.  Add Norvasc.  As needed labetalol. I will also check other etiologies of secondary hypertension including plasma metanephrine, 24-hour urine metanephrine.  PRA PAC.  Once renal artery stenosis ruled out add losartan.

## 2021-08-30 NOTE — Assessment & Plan Note (Signed)
See demand ischemia.  Troponin flat but elevated.

## 2021-08-30 NOTE — Progress Notes (Signed)
PT Cancellation Note  Patient Details Name: Kevin Morris MRN: [id number] DOB: 26-Nov-1980   Cancelled Treatment:    Reason Eval/Treat Not Completed: Other (comment): Per patient's nurse the patient demonstrates no functional deficits and has been ambulating independently while in the ED.  Per nursing pt with no rehab needs at this time.  Will complete PT orders at this time but will reassess pt pending a change in status upon receipt of new PT orders.    Ovidio Hanger PT, DPT 08/30/21, 2:01 PM

## 2021-08-30 NOTE — Progress Notes (Signed)
*  PRELIMINARY RESULTS* Echocardiogram 2D Echocardiogram has been performed.  Cristela Blue 08/30/2021, 9:28 AM

## 2021-08-30 NOTE — Assessment & Plan Note (Signed)
Recommend outpatient sleep study °

## 2021-08-31 ENCOUNTER — Inpatient Hospital Stay: Payer: Managed Care, Other (non HMO)

## 2021-08-31 DIAGNOSIS — I5033 Acute on chronic diastolic (congestive) heart failure: Secondary | ICD-10-CM | POA: Diagnosis not present

## 2021-08-31 LAB — CBC WITH DIFFERENTIAL/PLATELET
Abs Immature Granulocytes: 0.02 10*3/uL (ref 0.00–0.07)
Basophils Absolute: 0 10*3/uL (ref 0.0–0.1)
Basophils Relative: 0 %
Eosinophils Absolute: 0 10*3/uL (ref 0.0–0.5)
Eosinophils Relative: 0 %
HCT: 47.4 % (ref 39.0–52.0)
Hemoglobin: 16.6 g/dL (ref 13.0–17.0)
Immature Granulocytes: 0 %
Lymphocytes Relative: 23 %
Lymphs Abs: 1.7 10*3/uL (ref 0.7–4.0)
MCH: 30.5 pg (ref 26.0–34.0)
MCHC: 35 g/dL (ref 30.0–36.0)
MCV: 87 fL (ref 80.0–100.0)
Monocytes Absolute: 0.3 10*3/uL (ref 0.1–1.0)
Monocytes Relative: 4 %
Neutro Abs: 5.5 10*3/uL (ref 1.7–7.7)
Neutrophils Relative %: 73 %
Platelets: 318 10*3/uL (ref 150–400)
RBC: 5.45 MIL/uL (ref 4.22–5.81)
RDW: 12.8 % (ref 11.5–15.5)
WBC: 7.6 10*3/uL (ref 4.0–10.5)
nRBC: 0 % (ref 0.0–0.2)

## 2021-08-31 LAB — MAGNESIUM: Magnesium: 1.9 mg/dL (ref 1.7–2.4)

## 2021-08-31 LAB — BASIC METABOLIC PANEL
Anion gap: 10 (ref 5–15)
BUN: 13 mg/dL (ref 6–20)
CO2: 27 mmol/L (ref 22–32)
Calcium: 9.8 mg/dL (ref 8.9–10.3)
Chloride: 98 mmol/L (ref 98–111)
Creatinine, Ser: 1.31 mg/dL — ABNORMAL HIGH (ref 0.61–1.24)
GFR, Estimated: >60 mL/min (ref >60)
Glucose, Bld: 187 mg/dL — ABNORMAL HIGH (ref 70–99)
Potassium: 3.5 mmol/L (ref 3.5–5.1)
Sodium: 135 mmol/L (ref 135–145)

## 2021-08-31 MED ORDER — LOSARTAN POTASSIUM 50 MG PO TABS
50.0000 mg | ORAL_TABLET | Freq: Every day | ORAL | Status: DC
Start: 2021-08-31 — End: 2021-08-31

## 2021-08-31 MED ORDER — BUTALBITAL-APAP-CAFFEINE 50-325-40 MG PO TABS: 1.0000 | ORAL_TABLET | Freq: Four times a day (QID) | ORAL | 0 refills | Status: DC | PRN

## 2021-08-31 MED ORDER — DOCUSATE SODIUM 100 MG PO CAPS
100.0000 mg | ORAL_CAPSULE | Freq: Two times a day (BID) | ORAL | 0 refills | Status: DC
Start: 2021-08-31 — End: 2021-09-10

## 2021-08-31 MED ORDER — HYDRALAZINE HCL 100 MG PO TABS: 100.0000 mg | ORAL_TABLET | Freq: Three times a day (TID) | ORAL | 0 refills | Status: DC

## 2021-08-31 MED ORDER — ASPIRIN 81 MG PO TBEC
81.0000 mg | DELAYED_RELEASE_TABLET | Freq: Every day | ORAL | 11 refills | Status: DC
Start: 2021-09-01 — End: 2022-03-19

## 2021-08-31 MED ORDER — AMLODIPINE BESYLATE 5 MG PO TABS: 5.0000 mg | ORAL_TABLET | Freq: Every day | ORAL | 0 refills | Status: DC

## 2021-08-31 MED ORDER — PANTOPRAZOLE SODIUM 40 MG PO TBEC: 40.0000 mg | DELAYED_RELEASE_TABLET | Freq: Every day | ORAL | 0 refills | Status: DC

## 2021-08-31 NOTE — Plan of Care (Signed)

## 2021-08-31 NOTE — Consult Note (Signed)
   Heart Failure Nurse Navigator Note  HFpEF 655-60%.  Moderate left ventricular hypertrophy.  Normal right ventricular systolic function.  Mild mitral regurgitation.  Moderate left atrial enlargement.  He presented to the emergency room with increasing shortness of breath, swelling, orthopnea and PND.  Chest x-ray revealed cardiomegaly with vascular congestion.  BNP 251.6  Comorbidities:  Hypertension Obesity  Medications:  Amlodipine 5 mg daily Aspirin 81 mg daily Furosemide 40 mg IV daily Hydralazine 100 mg every 8 hours Losartan 50 mg daily   Labs:  Sodium 135, potassium 3.5, chloride 98, CO2 27, BUN 13, creatinine 1.3, magnesium 1.9.  Initial meeting with patient, brother and mother were also at the bedside.  Discussed heart failure.  Also discussed hypertension and reasoning behind keeping his blood pressure under control.  Directed on obtaining blood pressure cuff and monitoring his blood pressure daily and recording, instructed to sit for 5 to 10 minutes before checking his blood pressure.  Also discussed heart failure signs and symptoms and what to report.  Patient states that he has a scale and instructed on daily weights and recording.  He states that he is currently user of salt.  Explained the reasoning behind removing the saltshaker from the table.,  Also discussed eating in restaurants and foods to avoid.  He had no further questions.  He has an appointment in the outpatient heart failure clinic on December 5 at 12 PM.  He has no history of not showing to appointments.  Tresa Endo RN [facility]

## 2021-08-31 NOTE — Progress Notes (Signed)
Nutrition Brief Note  Patient identified on the Malnutrition Screening Tool (MST) Report  Wt Readings from Last 15 Encounters:  08/31/21 (!) 156.4 kg  12/08/15 136.1 kg   Kevin Morris is a 40 y.o. male seen in ed with complaints of shortness of breath for the past 3 months.  Patient also reports dyspnea on exertion when he walks from one room to the other at home.  He reports generalized weakness.  He gets short winded when he is trying to go up steps.  Patient also reports orthopnea.  Mild intermittent chest pain with exertion as well.  Pt admitted with SOB and hypertensive urgency.   Reviewed I/O's: -760 ml x 24 hours  UOP: 1 L x 24 hours  Spoke with pt at bedside, who reports feeling well today. He reports he has a good appetite and usually consumes 3 meals per day (Breakfast: eggs, toast, and bacon OR fruit and cereal; Lunch: snack foods or fruit; Dinner: meat, starch, and vegetable). Pt reports he generally tries to eat healthfully, but "fell off the wagon".   Pt denies any weight loss and has no further questions.   Nutrition-Focused physical exam completed. Findings are no fat depletion, no muscle depletion, and none edema.    Medications reviewed and include colace and reglan.   Obesity is a complex, chronic medical condition that is optimally managed by a multidisciplinary care team. Weight loss is not an ideal goal for an acute inpatient hospitalization. However, if further work-up for obesity is warranted, consider outpatient referral to outpatient bariatric service and/or [facility].    Labs reviewed.    Body mass index is 41.97 kg/m. Patient meets criteria for morbid obesity based on current BMI.   Current diet order is Heart Healthy, patient is consuming approximately 100% of meals at this time. Labs and medications reviewed.   No nutrition interventions warranted at this time. If nutrition issues arise, please consult  RD.    Levada Schilling, RD, LDN, CDCES Registered Dietitian II Certified Diabetes Care and Education Specialist Please refer to [facility] for RD and/or RD on-call/weekend/after hours pager

## 2021-08-31 NOTE — Progress Notes (Signed)
Kevin Morris to be D/C'd Home per MD order.  Discussed prescriptions and follow up appointments with the patient. Prescriptions electronically submitted. medication list explained in detail. Pt verbalized understanding.  Allergies as of 08/31/2021       Reactions   Iodinated Diagnostic Agents Anaphylaxis   Shellfish-derived Products Anaphylaxis        Medication List     STOP taking these medications    famotidine 20 MG tablet Commonly known as: PEPCID   predniSONE 20 MG tablet Commonly known as: Deltasone       TAKE these medications    amLODipine 5 MG tablet Commonly known as: NORVASC Take 1 tablet (5 mg total) by mouth daily. Start taking on: September 01, 2021   aspirin 81 MG EC tablet Take 1 tablet (81 mg total) by mouth daily. Swallow whole. Start taking on: September 01, 2021   butalbital-acetaminophen-caffeine 50-325-40 MG tablet Commonly known as: FIORICET Take 1 tablet by mouth every 6 (six) hours as needed for headache.   docusate sodium 100 MG capsule Commonly known as: COLACE Take 1 capsule (100 mg total) by mouth 2 (two) times daily.   EPINEPHrine 0.3 mg/0.3 mL Soaj injection Commonly known as: EPI-PEN Inject 0.3 mLs (0.3 mg total) into the muscle once.   hydrALAZINE 100 MG tablet Commonly known as: APRESOLINE Take 1 tablet (100 mg total) by mouth 3 (three) times daily.   pantoprazole 40 MG tablet Commonly known as: PROTONIX Take 1 tablet (40 mg total) by mouth daily. Start taking on: September 01, 2021        Vitals:   08/31/21 0900 08/31/21 1126  BP: (!) 142/68 (!) 146/63  Pulse:  88  Resp:  18  Temp: 98.6 F (37 C) 98 F (36.7 C)  SpO2: 96% 100%    Skin clean, dry and intact without evidence of skin break down, no evidence of skin tears noted. IV catheter discontinued intact. Site without signs and symptoms of complications. Dressing and pressure applied. Pt denies pain at this time. No complaints noted.  An After Visit  Summary was printed and given to the patient. Patient escorted via WC, and D/C home via private auto.  Kevin Morris Kevin Morris

## 2021-08-31 NOTE — Progress Notes (Signed)
OT Cancellation Note  Patient Details Name: Joan Avetisyan MRN: [id number] DOB: 02-04-1981   Cancelled Treatment:    Reason Eval/Treat Not Completed: OT screened, no needs identified, will sign off Upon chart review and in speaking with PT, noted that pt appears to be at functional baseline. No current OT needs at this time. Will complete order. Thank you.  Rejeana Brock, MS, OTR/L ascom [phone] 08/31/21, 1:44 PM

## 2021-09-02 NOTE — Assessment & Plan Note (Signed)
We will continue scheduled Reglan.  As needed Zofran. Resolved Unremarkable k x-ray abdomen and CT head.

## 2021-09-02 NOTE — Assessment & Plan Note (Signed)
See demand ischemia.  Troponin flat but elevated.

## 2021-09-02 NOTE — Assessment & Plan Note (Signed)
Blood pressure significantly elevated. Along with that the patient has some headache.  No blurry vision.  No focal deficit. Due to persistent hypertension with headache.  Will check CT scan of the head. Continue aggressive antihypertensive regimen with hydralazine increasing to 100 mg.  Add Norvasc.  As needed labetalol. I will also check other etiologies of secondary hypertension including plasma metanephrine, 24-hour urine metanephrine.  PRA PAC.

## 2021-09-02 NOTE — Assessment & Plan Note (Signed)
Presents with complaints of shortness of breath, orthopnea and PND. Echocardiogram shows diastolic dysfunction with normal EF without any valvular abnormality. Chest x-ray shows cardiomegaly with mild vascular congestion. Currently on room air. Suspect this is combination of OSA and hypertensive heart failure. Gentle diuresis with IV Lasix. Now on oral.

## 2021-09-02 NOTE — Assessment & Plan Note (Signed)
Placing the patient at high risk for poor outcome. Patient may also have sleep apnea which is leading to hypertension. Recommend outpatient sleep study.

## 2021-09-02 NOTE — Assessment & Plan Note (Signed)
Mildly elevated troponin secondary to hypertensive emergency. Echocardiogram shows no valvular abnormality.  Patient does not have any chest pain.  Monitor.

## 2021-09-02 NOTE — Discharge Summary (Signed)
Physician Discharge Summary   Patient name: Kevin Morris  Admit date:     08/29/2021  Discharge date: 08/31/2021   Discharge Physician: Lynden Oxford   PCP: Pcp, No   Recommendations at discharge: follow up as recommended   Discharge Diagnoses Principal Problem:   Acute on chronic diastolic CHF (congestive heart failure) (HCC) Active Problems:   Hypertensive emergency   Elevated troponin   Demand ischemia (HCC)   Obesity, Class III, BMI 40-49.9 (morbid obesity) (HCC)   Intractable nausea and vomiting   Needs sleep apnea assessment  Hospital Course   11/23 admitted to the hospital. 11/24 echocardiogram performed shows 55 to 60% EF, no WMA, LVH seen.  No valvular abnormality. Started to have nausea and vomiting in the afternoon.   * Acute on chronic diastolic CHF (congestive heart failure) (HCC) Presents with complaints of shortness of breath, orthopnea and PND. Echocardiogram shows diastolic dysfunction with normal EF without any valvular abnormality. Chest x-ray shows cardiomegaly with mild vascular congestion. Currently on room air. Suspect this is combination of OSA and hypertensive heart failure. Gentle diuresis with IV Lasix. Now on oral.   Hypertensive emergency Blood pressure significantly elevated. Along with that the patient has some headache.  No blurry vision.  No focal deficit. Due to persistent hypertension with headache.  Will check CT scan of the head. Continue aggressive antihypertensive regimen with hydralazine increasing to 100 mg.  Add Norvasc.  As needed labetalol. I will also check other etiologies of secondary hypertension including plasma metanephrine, 24-hour urine metanephrine.  PRA PAC.  Demand ischemia (HCC) Mildly elevated troponin secondary to hypertensive emergency. Echocardiogram shows no valvular abnormality.  Patient does not have any chest pain.  Monitor.  Elevated troponin See demand ischemia.  Troponin flat but  elevated.  Intractable nausea and vomiting We will continue scheduled Reglan.  As needed Zofran. Resolved Unremarkable k x-ray abdomen and CT head.  Obesity, Class III, BMI 40-49.9 (morbid obesity) (HCC) Placing the patient at high risk for poor outcome. Patient may also have sleep apnea which is leading to hypertension. Recommend outpatient sleep study.  Needs sleep apnea assessment Recommend outpatient sleep study.     Procedures performed: Echocardiogram    Condition at discharge: good  Exam General: Appear in mild distress, no Rash; Oral Mucosa Clear, moist. no Abnormal Neck Mass Or lumps, Conjunctiva normal  Cardiovascular: S1 and S2 Present, no Murmur, Respiratory: good respiratory effort, Bilateral Air entry present and CTA, no Crackles, no wheezes Abdomen: Bowel Sound present, Soft and no tenderness Extremities: no Pedal edema Neurology: alert and oriented to time, place, and person affect appropriate. no new focal deficit Gait not checked due to patient safety concerns     Disposition: Home  Discharge time: greater than 30 minutes.  Follow-up Information     PCP. Schedule an appointment as soon as possible for a visit in 2 week(s).                  Allergies as of 08/31/2021       Reactions   Iodinated Diagnostic Agents Anaphylaxis   Shellfish-derived Products Anaphylaxis        Medication List     STOP taking these medications    famotidine 20 MG tablet Commonly known as: PEPCID   predniSONE 20 MG tablet Commonly known as: Deltasone       TAKE these medications    amLODipine 5 MG tablet Commonly known as: NORVASC Take 1 tablet (5 mg total) by mouth daily.  aspirin 81 MG EC tablet Take 1 tablet (81 mg total) by mouth daily. Swallow whole.   butalbital-acetaminophen-caffeine 50-325-40 MG tablet Commonly known as: FIORICET Take 1 tablet by mouth every 6 (six) hours as needed for headache.   docusate sodium 100 MG  capsule Commonly known as: COLACE Take 1 capsule (100 mg total) by mouth 2 (two) times daily.   EPINEPHrine 0.3 mg/0.3 mL Soaj injection Commonly known as: EPI-PEN Inject 0.3 mLs (0.3 mg total) into the muscle once.   hydrALAZINE 100 MG tablet Commonly known as: APRESOLINE Take 1 tablet (100 mg total) by mouth 3 (three) times daily.   pantoprazole 40 MG tablet Commonly known as: PROTONIX Take 1 tablet (40 mg total) by mouth daily.        DG Chest 2 View  Result Date: 08/29/2021 CLINICAL DATA:  Shortness of breath. EXAM: CHEST - 2 VIEW COMPARISON:  None. FINDINGS: Mild cardiomegaly. Normal mediastinal contours. Normal pulmonary vascularity. No focal consolidation, pleural effusion, or pneumothorax. No acute osseous abnormality. IMPRESSION: 1. Mild cardiomegaly. Electronically Signed   By: Obie Dredge M.D.   On: 08/29/2021 13:14   CT HEAD WO CONTRAST ( )  Result Date: 08/30/2021 CLINICAL DATA:  Classic migraine.  Headache. EXAM: CT HEAD WITHOUT CONTRAST TECHNIQUE: Contiguous axial images were obtained from the base of the skull through the vertex without intravenous contrast. COMPARISON:  None. FINDINGS: Brain: No evidence of acute infarction, hemorrhage, hydrocephalus, extra-axial collection or mass lesion/mass effect. Vascular: No hyperdense vessel or unexpected calcification. Skull: Normal. Negative for fracture or focal lesion. Sinuses/Orbits: No acute finding. Other: None. IMPRESSION: Normal.  No cause for patient's headache identified. Electronically Signed   By: Gerome Sam III M.D.   On: 08/30/2021 19:03   DG Abd Portable 1V  Result Date: 08/30/2021 CLINICAL DATA:  nausea and vomiting. EXAM: PORTABLE ABDOMEN - 1 VIEW COMPARISON:  Chest radiograph 1 day prior FINDINGS: Two supine views of the abdomen and pelvis. Non-obstructive bowel gas pattern. No gross free intraperitoneal air. No abnormal abdominal calcifications. No appendicolith. Distal gas identified.  IMPRESSION: No acute findings. Electronically Signed   By: Jeronimo Greaves M.D.   On: 08/30/2021 18:57   US RENAL ARTERY DUPLEX COMPLETE  Result Date: 08/31/2021 CLINICAL DATA:  Hypertension EXAM: RENAL/URINARY TRACT ULTRASOUND RENAL DUPLEX DOPPLER ULTRASOUND COMPARISON:  None. FINDINGS: Right Kidney: Length: 12.6 cm. Echogenicity within normal limits. No mass or hydronephrosis visualized. Left Kidney: Length: 12.6 cm. Echogenicity within normal limits. No mass or hydronephrosis visualized. Bladder:  Unremarkable by ultrasound RENAL DUPLEX ULTRASOUND Right Renal Artery Velocities: Origin:  159 cm/sec Mid:  171 cm/sec Hilum:  173 cm/sec Interlobar:  100 cm/sec Arcuate:  64 cm/sec Left Renal Artery Velocities: Origin:  65 cm/sec Mid:  91 cm/sec Hilum:  116 cm/sec Interlobar:  173 cm/sec Arcuate:  77 cm/sec Aortic Velocity:  56 cm/sec Right Renal-Aortic Ratios: Origin: 2.8 Mid:  3.1 Hilum: 3.1 Interlobar: 1.8 Arcuate: 1.1 Left Renal-Aortic Ratios: Origin: 1.2 Mid: 1.6 Hilum: 2.1 Interlobar: 3.1 Arcuate: 1.4 Limited exam because of body habitus. No significant peak systolic velocity abnormality or abnormal ratio to suggest significant renal artery stenosis by duplex criteria. IMPRESSION: Negative for renal artery stenosis by duplex criteria. No acute renal abnormality. Electronically Signed   By: Judie Petit.  Shick M.D.   On: 08/31/2021 08:38   ECHOCARDIOGRAM COMPLETE  Result Date: 08/30/2021    ECHOCARDIOGRAM REPORT   Patient Name:   LASALLE ABEE Date of Exam: 08/30/2021 Medical Rec #:  [id number]  Height:       76.0 in Accession #:    [id number]       Weight:       350.0 lb Date of Birth:  10-08-1980        BSA:          2.810 m Patient Age:    40 years         BP:           190/107 mmHg Patient Gender: M                HR:           63 bpm. Exam Location:  [facility] Procedure: 2D Echo, Color Doppler and Cardiac Doppler Indications:     CHF-acute systolic 428.21/ U98.11  History:         Patient has no prior history  of Echocardiogram examinations.  Sonographer:     Cristela Blue Referring Phys:  [id number] Eliezer Mccoy Deanda Ruddell Diagnosing Phys: Julien Nordmann MD  Sonographer Comments: Suboptimal apical window and no subcostal window. IMPRESSIONS  1. Left ventricular ejection fraction, by estimation, is 55 to 60%. The left ventricle has normal function. The left ventricle has no regional wall motion abnormalities. There is moderate asymmetric left ventricular hypertrophy of the mid to distal inferior and lateral segments. Left ventricular diastolic parameters are indeterminate.  2. Right ventricular systolic function is normal. The right ventricular size is normal.  3. Left atrial size was moderately dilated.  4. The mitral valve is normal in structure. Mild mitral valve regurgitation. No evidence of mitral stenosis.  5. The aortic valve was not well visualized. Aortic valve regurgitation is not visualized. Aortic valve sclerosis is present, with no evidence of aortic valve stenosis.  6. The inferior vena cava is normal in size with greater than 50% respiratory variability, suggesting right atrial pressure of 3 mmHg. FINDINGS  Left Ventricle: Left ventricular ejection fraction, by estimation, is 55 to 60%. The left ventricle has normal function. The left ventricle has no regional wall motion abnormalities. The left ventricular internal cavity size was normal in size. There is  moderate asymmetric left ventricular hypertrophy of the inferior and lateral segments. Left ventricular diastolic parameters are indeterminate. Right Ventricle: The right ventricular size is normal. No increase in right ventricular wall thickness. Right ventricular systolic function is normal. Left Atrium: Left atrial size was moderately dilated. Right Atrium: Right atrial size was normal in size. Pericardium: Trivial pericardial effusion is present. Mitral Valve: The mitral valve is normal in structure. Mild mitral valve regurgitation. No evidence of mitral valve  stenosis. MV peak gradient, 3.8 mmHg. The mean mitral valve gradient is 1.0 mmHg. Tricuspid Valve: The tricuspid valve is normal in structure. Tricuspid valve regurgitation is not demonstrated. No evidence of tricuspid stenosis. Aortic Valve: The aortic valve was not well visualized. Aortic valve regurgitation is not visualized. Aortic valve sclerosis is present, with no evidence of aortic valve stenosis. Aortic valve mean gradient measures 2.0 mmHg. Aortic valve peak gradient measures 4.2 mmHg. Aortic valve area, by VTI measures 4.20 cm. Pulmonic Valve: The pulmonic valve was normal in structure. Pulmonic valve regurgitation is not visualized. No evidence of pulmonic stenosis. Aorta: The aortic root is normal in size and structure. Venous: The inferior vena cava is normal in size with greater than 50% respiratory variability, suggesting right atrial pressure of 3 mmHg. IAS/Shunts: No atrial level shunt detected by color flow Doppler.  LEFT VENTRICLE PLAX 2D LVIDd:  4.80 cm   Diastology LVIDs:         3.10 cm   LV e' medial:    7.72 cm/s LV PW:         1.75 cm   LV E/e' medial:  10.3 LV IVS:        1.57 cm   LV e' lateral:   6.31 cm/s LVOT diam:     2.20 cm   LV E/e' lateral: 12.6 LV SV:         64 LV SV Index:   23 LVOT Area:     3.80 cm  RIGHT VENTRICLE RV Basal diam:  4.30 cm RV S prime:     14.10 cm/s TAPSE (M-mode): 3.6 cm LEFT ATRIUM            Index        RIGHT ATRIUM           Index LA diam:      6.10 cm  2.17 cm/m   RA Area:     25.70 cm LA Vol (A2C): 77.9 ml  27.72 ml/m  RA Volume:   77.20 ml  27.47 ml/m LA Vol (A4C): 133.0 ml 47.33 ml/m  AORTIC VALVE                    PULMONIC VALVE AV Area (Vmax):    2.94 cm     PV Vmax:        1.01 m/s AV Area (Vmean):   2.86 cm     PV Vmean:       71.200 cm/s AV Area (VTI):     4.20 cm     PV VTI:         0.182 m AV Vmax:           102.00 cm/s  PV Peak grad:   4.1 mmHg AV Vmean:          71.400 cm/s  PV Mean grad:   2.0 mmHg AV VTI:            0.152  m      RVOT Peak grad: 3 mmHg AV Peak Grad:      4.2 mmHg AV Mean Grad:      2.0 mmHg LVOT Vmax:         79.00 cm/s LVOT Vmean:        53.700 cm/s LVOT VTI:          0.168 m LVOT/AV VTI ratio: 1.11  AORTA Ao Root diam: 3.46 cm MITRAL VALVE               TRICUSPID VALVE MV Area (PHT): 5.16 cm    TR Peak grad:   14.4 mmHg MV Peak grad:  3.8 mmHg    TR Vmax:        190.00 cm/s MV Mean grad:  1.0 mmHg MV Vmax:       0.98 m/s    SHUNTS MV Vmean:      47.1 cm/s   Systemic VTI:  0.17 m MV Decel Time: 147 msec    Systemic Diam: 2.20 cm MV E velocity: 79.70 cm/s  Pulmonic VTI:  0.142 m MV A velocity: 38.10 cm/s MV E/A ratio:  2.09 Julien Nordmann MD Electronically signed by Julien Nordmann MD Signature Date/Time: 08/30/2021/11:00:29 AM    Final    Results for orders placed or performed during the hospital encounter of 08/29/21  Resp Panel by RT-PCR (Flu A&B, Covid) Nasopharyngeal Swab  Status: None   Collection Time: 08/30/21 11:30 AM   Specimen: Nasopharyngeal Swab; Nasopharyngeal(NP) swabs in vial transport medium  Result Value Ref Range Status   SARS Coronavirus 2 by RT PCR NEGATIVE NEGATIVE Final    Comment: (NOTE) SARS-CoV-2 target nucleic acids are NOT DETECTED.  The SARS-CoV-2 RNA is generally detectable in upper respiratory specimens during the acute phase of infection. The lowest concentration of SARS-CoV-2 viral copies this assay can detect is 138 copies/mL. A negative result does not preclude SARS-Cov-2 infection and should not be used as the sole basis for treatment or other patient management decisions. A negative result may occur with  improper specimen collection/handling, submission of specimen other than nasopharyngeal swab, presence of viral mutation(s) within the areas targeted by this assay, and inadequate number of viral copies(<138 copies/mL). A negative result must be combined with clinical observations, patient history, and epidemiological information. The expected result is  Negative.  Fact Sheet for Patients:  BloggerCourse.com  Fact Sheet for Healthcare Providers:  SeriousBroker.it  This test is no t yet approved or cleared by the [place] FDA and  has been authorized for detection and/or diagnosis of SARS-CoV-2 by FDA under an Emergency Use Authorization (EUA). This EUA will remain  in effect (meaning this test can be used) for the duration of the COVID-19 declaration under Section 564(b)(1) of the Act, 21 U.S.C.section 360bbb-3(b)(1), unless the authorization is terminated  or revoked sooner.       Influenza A by PCR NEGATIVE NEGATIVE Final   Influenza B by PCR NEGATIVE NEGATIVE Final    Comment: (NOTE) The Xpert Xpress SARS-CoV-2/FLU/RSV plus assay is intended as an aid in the diagnosis of influenza from Nasopharyngeal swab specimens and should not be used as a sole basis for treatment. Nasal washings and aspirates are unacceptable for Xpert Xpress SARS-CoV-2/FLU/RSV testing.  Fact Sheet for Patients: BloggerCourse.com  Fact Sheet for Healthcare Providers: SeriousBroker.it  This test is not yet approved or cleared by the [place] FDA and has been authorized for detection and/or diagnosis of SARS-CoV-2 by FDA under an Emergency Use Authorization (EUA). This EUA will remain in effect (meaning this test can be used) for the duration of the COVID-19 declaration under Section 564(b)(1) of the Act, 21 U.S.C. section 360bbb-3(b)(1), unless the authorization is terminated or revoked.  Performed at [facility], [address] [place] [place], [place] [address]     Signed:  Lynden Oxford MD.  [facility] 09/02/2021, 6:58 AM

## 2021-09-02 NOTE — Assessment & Plan Note (Signed)
Recommend outpatient sleep study °

## 2021-09-04 LAB — ALDOSTERONE + RENIN ACTIVITY W/ RATIO
ALDO / PRA Ratio: 3.9 (ref 0.0–30.0)
Aldosterone: 1.1 ng/dL (ref 0.0–30.0)
PRA LC/MS/MS: 0.284 ng/mL/hr (ref 0.167–5.380)

## 2021-09-07 NOTE — Progress Notes (Signed)
Patient ID: Kevin Morris, male    DOB: Nov 21, 1980, 40 y.o.   MRN: [id number]  HPI  Mr Deskin is a 40 y/o male with a history of HTN and chronic heart failure.   Echo report from 08/30/21 reviewed and showed an EF of 55-60% along with moderate LVH/ LAE and mild MR.   Admitted 08/29/21 due to acute on chronic HF. Initially given IV lasix with transition to oral diuretics. Head CT performed due to HTN/ HA and it was without acute findings. HTN meds adjusted. Elevated troponin thought to be due to HTN. Discharged after 2 days.   He presents today for his initial visit with a chief complaint of minimal fatigue upon moderate exertion. He describes this as having been present for several months. He has associated chest pain and pain along the right side of his neck along with this. He denies any difficulty sleeping, dizziness, abdominal distention, palpitations, pedal edema, shortness of breath, cough or weight gain.   Says that he's recently gotten back into his weight lifting and is now going to the gym 6 days/ week to lift. Says that he occasionally gets chest pain but he thinks it's because he's lifting too much weight.   Has been told by his partner that he snores and he has woken himself up gasping.   Past Medical History:  Diagnosis Date   CHF (congestive heart failure) (HCC)    Hypertension    History reviewed. No pertinent surgical history. Family History  Family history unknown: Yes   Social History   Tobacco Use   Smoking status: Never   Smokeless tobacco: Not on file  Substance Use Topics   Alcohol use: Yes   Allergies  Allergen Reactions   Iodinated Diagnostic Agents Anaphylaxis   Shellfish-Derived Products Anaphylaxis   Prior to Admission medications   Medication Sig Start Date End Date Taking? Authorizing Provider  amLODipine (NORVASC) 5 MG tablet Take 1 tablet (5 mg total) by mouth daily. 09/01/21  Yes Rolly Salter, MD  aspirin EC 81 MG EC tablet Take 1  tablet (81 mg total) by mouth daily. Swallow whole. 09/01/21  Yes Rolly Salter, MD  butalbital-acetaminophen-caffeine (FIORICET) [phone] MG tablet Take 1 tablet by mouth every 6 (six) hours as needed for headache. 08/31/21  Yes Rolly Salter, MD  hydrALAZINE (APRESOLINE) 100 MG tablet Take 1 tablet (100 mg total) by mouth 3 (three) times daily. 08/31/21  Yes Rolly Salter, MD  pantoprazole (PROTONIX) 40 MG tablet Take 1 tablet (40 mg total) by mouth daily. 09/01/21  Yes Rolly Salter, MD  EPINEPHrine 0.3 mg/0.3 mL IJ SOAJ injection Inject 0.3 mLs (0.3 mg total) into the muscle once. Patient not taking: Reported on 08/29/2021 12/08/15   Jeanmarie Plant, MD   Review of Systems  Constitutional:  Positive for fatigue. Negative for appetite change.  HENT:  Negative for congestion, postnasal drip and sore throat.   Eyes: Negative.   Respiratory:  Positive for apnea. Negative for cough, chest tightness and shortness of breath.        +snoring  Cardiovascular:  Positive for chest pain (rarely). Negative for palpitations and leg swelling.  Gastrointestinal:  Negative for abdominal distention and abdominal pain.  Endocrine: Negative.   Genitourinary: Negative.   Musculoskeletal:  Positive for neck pain (right side of neck). Negative for back pain.  Skin: Negative.   Allergic/Immunologic: Negative.   Neurological:  Negative for dizziness and light-headedness.  Hematological:  Negative for adenopathy.  Does not bruise/bleed easily.  Psychiatric/Behavioral:  Negative for dysphoric mood and sleep disturbance (sleeping on 3 pillows). The patient is not nervous/anxious.    Vitals:   09/10/21 1203  BP: (!) 162/74  Pulse: 78  Resp: 18  SpO2: 99%  Weight: (!) 347 lb 4 oz (157.5 kg)  Height: 6\' 4"  (1.93 m)   Wt Readings from Last 3 Encounters:  09/10/21 (!) 347 lb 4 oz (157.5 kg)  08/31/21 (!) 344 lb 12.8 oz (156.4 kg)  12/08/15 300 lb (136.1 kg)   Lab Results  Component Value Date    CREATININE 1.31 (H) 08/31/2021   CREATININE 1.05 08/30/2021   CREATININE 1.06 08/29/2021   Physical Exam Vitals and nursing note reviewed.  Constitutional:      Appearance: Normal appearance.  HENT:     Head: Normocephalic and atraumatic.  Cardiovascular:     Rate and Rhythm: Normal rate and regular rhythm.  Pulmonary:     Effort: Pulmonary effort is normal. No respiratory distress.     Breath sounds: No wheezing or rales.  Abdominal:     General: There is no distension.     Palpations: Abdomen is soft.  Musculoskeletal:        General: No tenderness.     Cervical back: Normal range of motion and neck supple.     Right lower leg: No edema.     Left lower leg: No edema.  Skin:    General: Skin is warm and dry.  Neurological:     General: No focal deficit present.     Mental Status: He is alert and oriented to person, place, and time.  Psychiatric:        Mood and Affect: Mood normal.        Behavior: Behavior normal.        Thought Content: Thought content normal.   Assessment & Plan:  1: Chronic heart failure with preserved ejection fraction with structural changes (LVH/LAE)- - NYHA class II - euvolemic today - weighing daily; instructed to call for an overnight weight gain of > 2 pounds or a weekly weight gain of > 5 pounds - not adding salt to his food and has been reading food labels for sodium content; low sodium cookbook provided today - consider adding entresto and/or jardiance - BNP 08/29/21 was 251.6  2: HTN- - BP elevated today (162/74) - does not have PCP so appt scheduled with [facility] on 09/19/21 - should home BP consistently stay >160, he's to call the office - BMP 08/31/21 reviewed and showed sodium 135, potassium 3.5, creatinine 1.31 and GFR >60  3: Snoring- - will refer for sleep study   Medication bottles reviewed.   Return in 1 month or sooner for any questions/problems before then.

## 2021-09-10 ENCOUNTER — Other Ambulatory Visit: Payer: Self-pay

## 2021-09-10 ENCOUNTER — Ambulatory Visit: Payer: Managed Care, Other (non HMO) | Attending: Family | Admitting: Family

## 2021-09-10 ENCOUNTER — Encounter: Payer: Self-pay | Admitting: Family

## 2021-09-10 VITALS — BP 162/74 | HR 78 | Resp 18 | Ht 76.0 in | Wt 347.2 lb

## 2021-09-10 DIAGNOSIS — M542 Cervicalgia: Secondary | ICD-10-CM | POA: Insufficient documentation

## 2021-09-10 DIAGNOSIS — I5032 Chronic diastolic (congestive) heart failure: Secondary | ICD-10-CM

## 2021-09-10 DIAGNOSIS — I110 Hypertensive heart disease with heart failure: Secondary | ICD-10-CM | POA: Diagnosis not present

## 2021-09-10 DIAGNOSIS — R5383 Other fatigue: Secondary | ICD-10-CM | POA: Diagnosis present

## 2021-09-10 DIAGNOSIS — I10 Essential (primary) hypertension: Secondary | ICD-10-CM | POA: Diagnosis not present

## 2021-09-10 DIAGNOSIS — R0683 Snoring: Secondary | ICD-10-CM

## 2021-09-10 DIAGNOSIS — R079 Chest pain, unspecified: Secondary | ICD-10-CM | POA: Insufficient documentation

## 2021-09-10 NOTE — Patient Instructions (Addendum)
Continue weighing daily and call for an overnight weight gain of 3 pounds or more or a weekly weight gain of more than 5 pounds.    If your blood pressure is consistently above 160, let me know.

## 2021-09-18 ENCOUNTER — Telehealth: Payer: Self-pay | Admitting: Family

## 2021-09-18 NOTE — Telephone Encounter (Signed)
He has been scheduled for a sleep study for 10/17/21.   Corisa Montini, NT

## 2021-09-19 ENCOUNTER — Ambulatory Visit: Payer: Managed Care, Other (non HMO) | Admitting: Internal Medicine

## 2021-09-19 ENCOUNTER — Encounter: Payer: Self-pay | Admitting: Internal Medicine

## 2021-09-19 ENCOUNTER — Other Ambulatory Visit: Payer: Self-pay

## 2021-09-19 VITALS — BP 150/90 | HR 96 | Temp 98.2°F | Resp 16 | Ht 76.0 in | Wt 353.0 lb

## 2021-09-19 DIAGNOSIS — J4520 Mild intermittent asthma, uncomplicated: Secondary | ICD-10-CM

## 2021-09-19 DIAGNOSIS — I5030 Unspecified diastolic (congestive) heart failure: Secondary | ICD-10-CM

## 2021-09-19 DIAGNOSIS — I10 Essential (primary) hypertension: Secondary | ICD-10-CM | POA: Diagnosis not present

## 2021-09-19 DIAGNOSIS — Z0189 Encounter for other specified special examinations: Secondary | ICD-10-CM

## 2021-09-19 DIAGNOSIS — Z23 Encounter for immunization: Secondary | ICD-10-CM | POA: Diagnosis not present

## 2021-09-19 DIAGNOSIS — R112 Nausea with vomiting, unspecified: Secondary | ICD-10-CM

## 2021-09-19 DIAGNOSIS — G44219 Episodic tension-type headache, not intractable: Secondary | ICD-10-CM

## 2021-09-19 MED ORDER — LISINOPRIL 10 MG PO TABS: 10.0000 mg | ORAL_TABLET | Freq: Every day | ORAL | 3 refills | Status: DC

## 2021-09-19 MED ORDER — HYDRALAZINE HCL 100 MG PO TABS
100.0000 mg | ORAL_TABLET | Freq: Three times a day (TID) | ORAL | 0 refills | Status: DC
Start: 2021-09-19 — End: 2021-10-15

## 2021-09-19 MED ORDER — ALBUTEROL SULFATE HFA 108 (90 BASE) MCG/ACT IN AERS: 2.0000 | INHALATION_SPRAY | Freq: Four times a day (QID) | RESPIRATORY_TRACT | 0 refills | Status: DC | PRN

## 2021-09-19 MED ORDER — TIZANIDINE HCL 4 MG PO TABS: 4.0000 mg | ORAL_TABLET | Freq: Four times a day (QID) | ORAL | 0 refills | Status: DC | PRN

## 2021-09-19 NOTE — Progress Notes (Signed)
New Patient Office Visit  Subjective:  Patient ID: Kevin Morris, male    DOB: 25-Apr-1981  Age: 40 y.o. MRN: [id number]  CC:  Chief Complaint  Patient presents with   Establish Care    HPI Kevin Morris presents as a new patient. Chronic medical conditions include CHF, HTN, GERD, headaches. Had gone to the hospital in November, had fatigue, headache, sob. Prior to that he was healthy, had no chronic medical conditions and took no daily medications.   Hypertension: -Medications: Amlodipine 5, Hydralazine 100 TID -Patient is compliant with above medications and reports no side effects. -Checking BP at home (average): is checking at home - 176-195/90-105 -Still has some headaches occasionally but denies any SOB, CP, vision changes, LE edema or symptoms of hypotension - has had some episodes of LE swelling but not since hospital  -Diet: decreasing sodium in the diet -Exercise: trying to start working out again with weights, had been a professional football player in the past  HFpEF: -Last echo 08/30/21 - EF 55-60% without wall motion abnormalities or valvular pathology and LVH -Following up with HTN clinic but doesn't have a cardiologist   GERD: -Pantoprazole 40 mg from the hospital but not having symptoms currently   Headaches: -Getting 2-3 times a week, lasts about 1-2 hour -Stress can trigger them or looking at the computer  -Pain is all over, sometimes in the back of head or on one side, often radiates from neck/shoulders -Sleeping will resolve pain.   Asthma: diagnosis from childhood/exercise induced asthma  -Asthma status: controlled -Current Treatments: Albuterol as needed and another inhaler - not sure which one  -Satisfied with current treatment?: yes -Albuterol/rescue inhaler frequency: only with working out  -Dyspnea frequency: better since hospitalization  -Wheezing frequency: sometimes with working out  -Cough frequency: no -Nocturnal symptom frequency:  none -Limitation of activity: yes -Current upper respiratory symptoms: no -Triggers: exercise -Pneumovax:  will discuss at follow up -Influenza:  given today   Past Medical History:  Diagnosis Date   CHF (congestive heart failure) (HCC)    Generalized headaches    GERD (gastroesophageal reflux disease)    Hypertension     Past Surgical History:  Procedure Laterality Date   ACHILLES TENDON SURGERY Right    KNEE ARTHROSCOPY Bilateral    ORIF FINGER / THUMB FRACTURE Bilateral    SHOULDER ARTHROSCOPY WITH LABRAL REPAIR Right     Family History  Problem Relation Age of Onset   Hypertension Mother    Cancer Maternal Grandmother        breast   Diabetes Maternal Grandmother     Social History   Socioeconomic History   Marital status: Single    Spouse name: Not on file   Number of children: Not on file   Years of education: Not on file   Highest education level: Not on file  Occupational History   Not on file  Tobacco Use   Smoking status: Some Days    Types: Cigarettes, Cigars   Smokeless tobacco: Not on file  Vaping Use   Vaping Use: Never used  Substance and Sexual Activity   Alcohol use: Yes    Comment: occassional   Drug use: Never   Sexual activity: Yes  Other Topics Concern   Not on file  Social History Narrative   Not on file   Social Determinants of Health   Financial Resource Strain: Not on file  Food Insecurity: Not on file  Transportation Needs: Not on file  Physical  Activity: Not on file  Stress: Not on file  Social Connections: Not on file  Intimate Partner Violence: Not on file    ROS Review of Systems  Constitutional:  Negative for chills and fever.  Respiratory:  Positive for wheezing. Negative for cough and shortness of breath.   Cardiovascular:  Negative for chest pain, palpitations and leg swelling.  Gastrointestinal:  Negative for abdominal pain, nausea and vomiting.  Musculoskeletal:  Positive for neck pain.  Neurological:   Positive for headaches. Negative for dizziness.   Objective:   Today's Vitals: BP (!) 152/84    Pulse 96    Temp 98.2 F (36.8 C)    Resp 16    Ht 6\' 4"  (1.93 m)    Wt (!) 353 lb (160.1 kg)    SpO2 98%    BMI 42.97 kg/m   Physical Exam Constitutional:      Appearance: Normal appearance.  HENT:     Head: Normocephalic and atraumatic.  Eyes:     Conjunctiva/sclera: Conjunctivae normal.  Cardiovascular:     Rate and Rhythm: Normal rate and regular rhythm.  Pulmonary:     Effort: Pulmonary effort is normal.     Breath sounds: Normal breath sounds.  Abdominal:     General: There is no distension.     Palpations: Abdomen is soft.     Tenderness: There is no abdominal tenderness.  Musculoskeletal:     Right lower leg: No edema.     Left lower leg: No edema.  Skin:    General: Skin is warm and dry.  Neurological:     General: No focal deficit present.     Mental Status: He is alert. Mental status is at baseline.  Psychiatric:        Mood and Affect: Mood normal.        Behavior: Behavior normal.    Assessment & Plan:   1. Primary hypertension/Heart failure with preserved ejection fraction, unspecified HF chronicity (HCC): Continue Amlodipine 5 mg, Hydralazine 100 TID and will add Lisinopril 10 mg today. He will continue to check blood pressure at home and is following with HTN clinic in January. For heart failure, cardiology referral to establish care. Discussed decreasing sodium intake in diet. Follow up here in 1 month for recheck.   - lisinopril (ZESTRIL) 10 MG tablet; Take 1 tablet (10 mg total) by mouth daily.  Dispense: 90 tablet; Refill: 3 - hydrALAZINE (APRESOLINE) 100 MG tablet; Take 1 tablet (100 mg total) by mouth 3 (three) times daily.  Dispense: 90 tablet; Refill: 0 - Ambulatory referral to Cardiology  2. Mild intermittent asthma without complication: Exercise induced. Albuterol as needed.   - albuterol (VENTOLIN HFA) 108 (90 Base) MCG/ACT inhaler; Inhale 2 puffs  into the lungs every 6 (six) hours as needed for wheezing or shortness of breath.  Dispense: 8 g; Refill: 0  3. Needs sleep apnea assessment: Sleep study scheduled.   4. Intractable nausea and vomiting: From hospital, resolved now. Discontinue PPI.   5. Episodic tension-type headache, not intractable: Headaches most likely tension or due to blood pressure. Discontinue Fioricet, treat tension headaches with NSAIDs as needed as well as muscle relaxer as needed and treatment of blood pressure.   - tiZANidine (ZANAFLEX) 4 MG tablet; Take 1 tablet (4 mg total) by mouth every 6 (six) hours as needed for muscle spasms.  Dispense: 30 tablet; Refill: 0  6. Need for Tdap vaccination: Tdap given today.   - Tdap vaccine greater than  or equal to 7yo IM  7. Flu vaccine need: Flu vaccine given today.   - Flu Vaccine QUAD 6+ mos PF IM (Fluarix Quad PF)   Follow-up: Return in about 4 weeks (around 10/17/2021).   Margarita Mail, DO

## 2021-09-19 NOTE — Patient Instructions (Signed)
It was great seeing you today!  Plan discussed at today's visit: -For blood pressure, continue Amlodipine 5 mg, Hydralazine 100 3 x daily and new medication Lisinopril 10 mg -Continue to check blood pressure at home and write these down -Referral to Cardiology placed for heart failure - watch sodium intake in the diet -Can stop Pantoprazole unless having acid reflux symptoms -Headaches - can use Ibuprofen or muscle relaxer -Asthma: Albuterol inhaler to use as needed.   Follow up in: 1 month or sooner as needed  Take care and let us know if you have any questions or concerns prior to your next visit.  Dr. Caralee Ates

## 2021-09-24 ENCOUNTER — Other Ambulatory Visit: Payer: Self-pay | Admitting: Internal Medicine

## 2021-09-24 DIAGNOSIS — G44219 Episodic tension-type headache, not intractable: Secondary | ICD-10-CM

## 2021-09-24 NOTE — Telephone Encounter (Signed)
Requested Prescriptions  Pending Prescriptions Disp Refills   tiZANidine (ZANAFLEX) 4 MG tablet [Pharmacy Med Name: TIZANIDINE 4MG  TABLETS] 30 tablet 0    Sig: TAKE 1 TABLET(4 MG) BY MOUTH EVERY 6 HOURS AS NEEDED FOR MUSCLE SPASMS     Not Delegated - Cardiovascular:  Alpha-2 Agonists - tizanidine Failed - 09/24/2021  3:29 AM      Failed - This refill cannot be delegated      Passed - Valid encounter within last 6 months    Recent Outpatient Visits          5 days ago Primary hypertension   [facility], DO      Future Appointments            In 3 weeks Margarita Mail, DO [facility], [facility]   In 1 month [facility], MD [facility], [facility]

## 2021-10-11 ENCOUNTER — Encounter: Payer: Self-pay | Admitting: Family

## 2021-10-11 ENCOUNTER — Ambulatory Visit: Payer: Managed Care, Other (non HMO) | Attending: Family | Admitting: Family

## 2021-10-11 ENCOUNTER — Other Ambulatory Visit: Payer: Self-pay

## 2021-10-11 VITALS — BP 150/79 | HR 90 | Resp 20 | Ht 76.0 in | Wt 347.1 lb

## 2021-10-11 DIAGNOSIS — F17210 Nicotine dependence, cigarettes, uncomplicated: Secondary | ICD-10-CM | POA: Diagnosis not present

## 2021-10-11 DIAGNOSIS — R079 Chest pain, unspecified: Secondary | ICD-10-CM | POA: Insufficient documentation

## 2021-10-11 DIAGNOSIS — Z7901 Long term (current) use of anticoagulants: Secondary | ICD-10-CM | POA: Insufficient documentation

## 2021-10-11 DIAGNOSIS — R5383 Other fatigue: Secondary | ICD-10-CM | POA: Insufficient documentation

## 2021-10-11 DIAGNOSIS — R0683 Snoring: Secondary | ICD-10-CM | POA: Diagnosis not present

## 2021-10-11 DIAGNOSIS — I110 Hypertensive heart disease with heart failure: Secondary | ICD-10-CM | POA: Diagnosis not present

## 2021-10-11 DIAGNOSIS — R0681 Apnea, not elsewhere classified: Secondary | ICD-10-CM | POA: Insufficient documentation

## 2021-10-11 DIAGNOSIS — I5032 Chronic diastolic (congestive) heart failure: Secondary | ICD-10-CM

## 2021-10-11 DIAGNOSIS — I10 Essential (primary) hypertension: Secondary | ICD-10-CM

## 2021-10-11 NOTE — Progress Notes (Signed)
Patient ID: Kevin Morris, male    DOB: 1981/07/14, 41 y.o.   MRN: [id number]  HPI  Mr Caruth is a 41 y/o male with a history of HTN and chronic heart failure.   Echo report from 08/30/21 reviewed and showed an EF of 55-60% along with moderate LVH/ LAE and mild MR.   Admitted 08/29/21 due to acute on chronic HF. Initially given IV lasix with transition to oral diuretics. Head CT performed due to HTN/ HA and it was without acute findings. HTN meds adjusted. Elevated troponin thought to be due to HTN. Discharged after 2 days.   He presents today for a follow-up visit with a chief complaint of minimal fatigue upon moderate exertion. He describes this as chronic in nature having been present for several months. He has associated periods of apnea and rare episodes of chest pain along with this. He denies any dizziness, difficulty sleeping, abdominal distention, palpitations, pedal edema, shortness of breath, cough or weight gain.   Past Medical History:  Diagnosis Date   CHF (congestive heart failure) (HCC)    Generalized headaches    GERD (gastroesophageal reflux disease)    Hypertension    Past Surgical History:  Procedure Laterality Date   ACHILLES TENDON SURGERY Right    KNEE ARTHROSCOPY Bilateral    ORIF FINGER / THUMB FRACTURE Bilateral    SHOULDER ARTHROSCOPY WITH LABRAL REPAIR Right    Family History  Problem Relation Age of Onset   Hypertension Mother    Cancer Maternal Grandmother        breast   Diabetes Maternal Grandmother    Social History   Tobacco Use   Smoking status: Some Days    Types: Cigarettes, Cigars   Smokeless tobacco: Not on file  Substance Use Topics   Alcohol use: Yes    Comment: occassional   Allergies  Allergen Reactions   Iodinated Contrast Media Anaphylaxis   Shellfish-Derived Products Anaphylaxis   Prior to Admission medications   Medication Sig Start Date End Date Taking? Authorizing Provider  albuterol (VENTOLIN HFA) 108 (90 Base)  MCG/ACT inhaler Inhale 2 puffs into the lungs every 6 (six) hours as needed for wheezing or shortness of breath. 09/19/21  Yes Margarita Mail, DO  amLODipine (NORVASC) 5 MG tablet Take 1 tablet (5 mg total) by mouth daily. 09/01/21  Yes Rolly Salter, MD  aspirin EC 81 MG EC tablet Take 1 tablet (81 mg total) by mouth daily. Swallow whole. 09/01/21  Yes Rolly Salter, MD  hydrALAZINE (APRESOLINE) 100 MG tablet Take 1 tablet (100 mg total) by mouth 3 (three) times daily. 09/19/21  Yes Margarita Mail, DO  lisinopril (ZESTRIL) 10 MG tablet Take 1 tablet (10 mg total) by mouth daily. 09/19/21  Yes Margarita Mail, DO  pantoprazole (PROTONIX) 40 MG tablet Take 40 mg by mouth daily.   Yes [provider]  tiZANidine (ZANAFLEX) 4 MG tablet Take 1 tablet (4 mg total) by mouth every 6 (six) hours as needed for muscle spasms. 09/19/21  Yes Margarita Mail, DO  EPINEPHrine 0.3 mg/0.3 mL IJ SOAJ injection Inject 0.3 mLs (0.3 mg total) into the muscle once. Patient not taking: Reported on 10/11/2021 12/08/15   Jeanmarie Plant, MD   Review of Systems  Constitutional:  Positive for fatigue. Negative for appetite change.  HENT:  Negative for congestion, postnasal drip and sore throat.   Eyes: Negative.   Respiratory:  Positive for apnea. Negative for cough, chest tightness and shortness of breath.        +  snoring  Cardiovascular:  Positive for chest pain (rarely). Negative for palpitations and leg swelling.  Gastrointestinal:  Negative for abdominal distention and abdominal pain.  Endocrine: Negative.   Genitourinary: Negative.   Musculoskeletal:  Positive for neck pain (right side of neck). Negative for back pain.  Skin: Negative.   Allergic/Immunologic: Negative.   Neurological:  Negative for dizziness and light-headedness.  Hematological:  Negative for adenopathy. Does not bruise/bleed easily.  Psychiatric/Behavioral:  Negative for dysphoric mood and sleep disturbance (sleeping on  3 pillows). The patient is not nervous/anxious.    Vitals:   10/11/21 1217  BP: (!) 150/79  Pulse: 90  Resp: 20  SpO2: 95%  Weight: (!) 347 lb 2 oz (157.5 kg)  Height: 6\' 4"  (1.93 m)   Wt Readings from Last 3 Encounters:  10/11/21 (!) 347 lb 2 oz (157.5 kg)  09/19/21 (!) 353 lb (160.1 kg)  09/10/21 (!) 347 lb 4 oz (157.5 kg)   Lab Results  Component Value Date   CREATININE 1.31 (H) 08/31/2021   CREATININE 1.05 08/30/2021   CREATININE 1.06 08/29/2021   Physical Exam Vitals and nursing note reviewed.  Constitutional:      Appearance: Normal appearance.  HENT:     Head: Normocephalic and atraumatic.  Cardiovascular:     Rate and Rhythm: Normal rate and regular rhythm.  Pulmonary:     Effort: Pulmonary effort is normal. No respiratory distress.     Breath sounds: No wheezing or rales.  Abdominal:     General: There is no distension.     Palpations: Abdomen is soft.  Musculoskeletal:        General: No tenderness.     Cervical back: Normal range of motion and neck supple.     Right lower leg: No edema.     Left lower leg: No edema.  Skin:    General: Skin is warm and dry.  Neurological:     General: No focal deficit present.     Mental Status: He is alert and oriented to person, place, and time.  Psychiatric:        Mood and Affect: Mood normal.        Behavior: Behavior normal.        Thought Content: Thought content normal.   Assessment & Plan:  1: Chronic heart failure with preserved ejection fraction with structural changes (LVH/LAE)- - NYHA class II - euvolemic today - weighing daily; reminded to call for an overnight weight gain of > 2 pounds or a weekly weight gain of > 5 pounds - weight unchanged from last visit here 1 month ago - not adding salt to his food and has been reading food labels for sodium content - discussed changing his lisinopril to entresto but he's recently filled a 90 day supply so will discuss further at his next visit - sees  cardiology 08/31/2021) 10/25/21 - BNP 08/29/21 was 251.6  2: HTN- - BP mildly elevated (150/79) - saw PCP 08/31/21) 09/19/21 - goes to HTN clinic 11/06/21 - BMP 08/31/21 reviewed and showed sodium 135, potassium 3.5, creatinine 1.31 and GFR >60  3: Snoring- - sleep study was scheduled for 10/17/21 but patient says that he's cancelled it because he found out that his insurance will not cover the sleep study   Medication bottles reviewed.   Return in 2 months or sooner for any questions/problems before then.

## 2021-10-11 NOTE — Patient Instructions (Signed)
Continue weighing daily and call for an overnight weight gain of 3 pounds or more or a weekly weight gain of more than 5 pounds.  °

## 2021-10-12 ENCOUNTER — Other Ambulatory Visit: Payer: Self-pay | Admitting: Internal Medicine

## 2021-10-12 DIAGNOSIS — J4520 Mild intermittent asthma, uncomplicated: Secondary | ICD-10-CM

## 2021-10-12 NOTE — Telephone Encounter (Signed)
Requested Prescriptions  Pending Prescriptions Disp Refills   albuterol (VENTOLIN HFA) 108 (90 Base) MCG/ACT inhaler [Pharmacy Med Name: ALBUTEROL HFA INH (200 PUFFS) 6.7GM] 24 g 0    Sig: INHALE 2 PUFFS INTO THE LUNGS EVERY 6 HOURS AS NEEDED FOR WHEEZING OR SHORTNESS OF BREATH     Pulmonology:  Beta Agonists Failed - 10/12/2021  3:28 AM      Failed - One inhaler should last at least one month. If the patient is requesting refills earlier, contact the patient to check for uncontrolled symptoms.      Passed - Valid encounter within last 12 months    Recent Outpatient Visits          3 weeks ago Primary hypertension   [facility] Margarita Mail, DO      Future Appointments            In 1 week Margarita Mail, DO [facility], [facility]   In 1 week Marykay Lex, MD [facility]   In 3 weeks Chilton Si, MD [facility], [facility]

## 2021-10-15 ENCOUNTER — Other Ambulatory Visit: Payer: Self-pay | Admitting: Internal Medicine

## 2021-10-15 DIAGNOSIS — I5030 Unspecified diastolic (congestive) heart failure: Secondary | ICD-10-CM

## 2021-10-15 NOTE — Telephone Encounter (Signed)
Requested Prescriptions  Pending Prescriptions Disp Refills   hydrALAZINE (APRESOLINE) 100 MG tablet [Pharmacy Med Name: HYDRALAZINE 100MG  (HUNDRED MG) TABS] 180 tablet 2    Sig: TAKE 1 TABLET(100 MG) BY MOUTH THREE TIMES DAILY     Cardiovascular:  Vasodilators Failed - 10/15/2021  6:34 AM      Failed - Last BP in normal range    BP Readings from Last 1 Encounters:  10/11/21 (!) 150/79         Passed - HCT in normal range and within 360 days    HCT  Date Value Ref Range Status  08/31/2021 47.4 39.0 - 52.0 % Final         Passed - HGB in normal range and within 360 days    Hemoglobin  Date Value Ref Range Status  08/31/2021 16.6 13.0 - 17.0 g/dL Final         Passed - RBC in normal range and within 360 days    RBC  Date Value Ref Range Status  08/31/2021 5.45 4.22 - 5.81 MIL/uL Final         Passed - WBC in normal range and within 360 days    WBC  Date Value Ref Range Status  08/31/2021 7.6 4.0 - 10.5 K/uL Final         Passed - PLT in normal range and within 360 days    Platelets  Date Value Ref Range Status  08/31/2021 318 150 - 400 K/uL Final         Passed - Valid encounter within last 12 months    Recent Outpatient Visits          3 weeks ago Primary hypertension   [facility], DO      Future Appointments            In 4 days Margarita Mail, DO [facility], [facility]   In 1 week [facility], MD [facility], [facility]   In 3 weeks [facility], MD [facility], [facility]

## 2021-10-19 ENCOUNTER — Encounter: Payer: Self-pay | Admitting: Internal Medicine

## 2021-10-19 ENCOUNTER — Ambulatory Visit: Payer: Managed Care, Other (non HMO) | Admitting: Internal Medicine

## 2021-10-19 VITALS — BP 152/90 | HR 81 | Temp 98.3°F | Resp 16 | Ht 76.0 in | Wt 352.2 lb

## 2021-10-19 DIAGNOSIS — K219 Gastro-esophageal reflux disease without esophagitis: Secondary | ICD-10-CM

## 2021-10-19 DIAGNOSIS — G44219 Episodic tension-type headache, not intractable: Secondary | ICD-10-CM | POA: Diagnosis not present

## 2021-10-19 DIAGNOSIS — I10 Essential (primary) hypertension: Secondary | ICD-10-CM

## 2021-10-19 DIAGNOSIS — Z91013 Allergy to seafood: Secondary | ICD-10-CM

## 2021-10-19 DIAGNOSIS — I5030 Unspecified diastolic (congestive) heart failure: Secondary | ICD-10-CM | POA: Diagnosis not present

## 2021-10-19 MED ORDER — PANTOPRAZOLE SODIUM 40 MG PO TBEC: 40.0000 mg | DELAYED_RELEASE_TABLET | Freq: Every day | ORAL | 3 refills | Status: DC

## 2021-10-19 MED ORDER — HYDROCHLOROTHIAZIDE 12.5 MG PO CAPS: 12.5000 mg | ORAL_CAPSULE | Freq: Every day | ORAL | 3 refills | Status: DC

## 2021-10-19 MED ORDER — EPINEPHRINE 0.3 MG/0.3ML IJ SOAJ: 0.3000 mg | Freq: Once | INTRAMUSCULAR | 0 refills | Status: AC

## 2021-10-19 NOTE — Patient Instructions (Addendum)
It was great seeing you today!  Plan discussed at today's visit: -Blood pressure remains uncontrolled, continue Amlodipine 5, Hydralaizine 100 3x a day, increase Lisinopril to 20 mg daily (2 tablets) and new medication HCTZ at 12.5 mg   -Continue to check BP at home and write this down -Protonix refilled   Follow up in: 1 month  Take care and let us know if you have any questions or concerns prior to your next visit.  Dr. Caralee Ates

## 2021-10-19 NOTE — Progress Notes (Signed)
Established Patient Office Visit  Subjective:  Patient ID: Kevin Morris, male    DOB: Mar 20, 1981  Age: 41 y.o. MRN: [id number]  CC:  Chief Complaint  Patient presents with   Follow-up    4 weeks   Hypertension   Hyperlipidemia   Headache   Asthma    HPI Kevin Morris presents for follow up on blood pressure and headaches.   Hypertension: -Medications: Amlodipine 5, Hydralazine 100 TID, added Lisinopril 10 1 month ago -Patient is compliant with above medications and reports no side effects. -Checking BP at home (average): is checking at home - 160-180/70-90 -Denies any SOB, CP, vision changes, LE edema or symptoms of hypotension, headaches improving -Diet: decreasing sodium in the diet -Exercise: trying to start working out again with weights, had been a professional football player in the past   HFpEF: -Last echo 08/30/21 - EF 55-60% without wall motion abnormalities or valvular pathology and LVH -Following up with HTN clinic, considering starting Entreso once out of Lisinopril   GERD: -Eating more spicy foods which is making acid reflux symptoms return, restart Pantoprazole 40 mg    Headaches: -Decreasing in frequency with better controlled BP   Past Medical History:  Diagnosis Date   CHF (congestive heart failure) ([place])    Generalized headaches    GERD (gastroesophageal reflux disease)    Hypertension     Past Surgical History:  Procedure Laterality Date   ACHILLES TENDON SURGERY Right    KNEE ARTHROSCOPY Bilateral    ORIF FINGER / THUMB FRACTURE Bilateral    SHOULDER ARTHROSCOPY WITH LABRAL REPAIR Right     Family History  Problem Relation Age of Onset   Hypertension Mother    Cancer Maternal Grandmother        breast   Diabetes Maternal Grandmother     Social History   Socioeconomic History   Marital status: Single    Spouse name: Not on file   Number of children: Not on file   Years of education: Not on file   Highest education level:  Not on file  Occupational History   Not on file  Tobacco Use   Smoking status: Some Days    Types: Cigars   Smokeless tobacco: Not on file   Tobacco comments:    Very occasional cigar use  Vaping Use   Vaping Use: Never used  Substance and Sexual Activity   Alcohol use: Yes    Comment: occassional   Drug use: Never   Sexual activity: Yes  Other Topics Concern   Not on file  Social History Narrative   Not on file   Social Determinants of Health   Financial Resource Strain: Not on file  Food Insecurity: Not on file  Transportation Needs: Not on file  Physical Activity: Not on file  Stress: Not on file  Social Connections: Not on file  Intimate Partner Violence: Not on file    Outpatient Medications Prior to Visit  Medication Sig Dispense Refill   albuterol (VENTOLIN HFA) 108 (90 Base) MCG/ACT inhaler INHALE 2 PUFFS INTO THE LUNGS EVERY 6 HOURS AS NEEDED FOR WHEEZING OR SHORTNESS OF BREATH 24 g 0   amLODipine (NORVASC) 5 MG tablet Take 1 tablet (5 mg total) by mouth daily. 120 tablet 0   aspirin EC 81 MG EC tablet Take 1 tablet (81 mg total) by mouth daily. Swallow whole. 30 tablet 11   EPINEPHrine 0.3 mg/0.3 mL IJ SOAJ injection Inject 0.3 mLs (0.3 mg total) into the  muscle once. 1 Device 0   hydrALAZINE (APRESOLINE) 100 MG tablet TAKE 1 TABLET(100 MG) BY MOUTH THREE TIMES DAILY 180 tablet 2   lisinopril (ZESTRIL) 10 MG tablet Take 1 tablet (10 mg total) by mouth daily. 90 tablet 3   tiZANidine (ZANAFLEX) 4 MG tablet Take 1 tablet (4 mg total) by mouth every 6 (six) hours as needed for muscle spasms. 30 tablet 0   pantoprazole (PROTONIX) 40 MG tablet Take 40 mg by mouth daily. (Patient not taking: Reported on 10/19/2021)     No facility-administered medications prior to visit.    Allergies  Allergen Reactions   Iodinated Contrast Media Anaphylaxis   Shellfish-Derived Products Anaphylaxis    ROS Review of Systems  Constitutional:  Negative for chills and fever.   Eyes:  Negative for visual disturbance.  Respiratory:  Negative for cough and shortness of breath.   Cardiovascular:  Negative for chest pain, palpitations and leg swelling.  Gastrointestinal:  Negative for abdominal pain.  Neurological:  Negative for light-headedness and headaches.     Objective:    Physical Exam Constitutional:      Appearance: He is well-developed.  HENT:     Head: Normocephalic and atraumatic.  Eyes:     Conjunctiva/sclera: Conjunctivae normal.  Cardiovascular:     Rate and Rhythm: Normal rate and regular rhythm.  Pulmonary:     Effort: Pulmonary effort is normal.     Breath sounds: Normal breath sounds.  Musculoskeletal:     Right lower leg: No edema.     Left lower leg: No edema.  Skin:    General: Skin is warm and dry.  Neurological:     General: No focal deficit present.     Mental Status: He is alert. Mental status is at baseline.  Psychiatric:        Mood and Affect: Mood normal.        Behavior: Behavior normal.    BP (!) 152/90    Pulse 81    Temp 98.3 F (36.8 C)    Resp 16    Ht 6\' 4"  (1.93 m)    Wt (!) 352 lb 3.2 oz (159.8 kg)    SpO2 98%    BMI 42.87 kg/m  Wt Readings from Last 3 Encounters:  10/19/21 (!) 352 lb 3.2 oz (159.8 kg)  10/11/21 (!) 347 lb 2 oz (157.5 kg)  09/19/21 (!) 353 lb (160.1 kg)     Health Maintenance Due  Topic Date Due   COVID-19 Vaccine (1) Never done   Pneumococcal Vaccine 73-11 Years old (1 - PCV) Never done   Hepatitis C Screening  Never done    There are no preventive care reminders to display for this patient.  Lab Results  Component Value Date   TSH 2.219 08/29/2021   Lab Results  Component Value Date   WBC 7.6 08/31/2021   HGB 16.6 08/31/2021   HCT 47.4 08/31/2021   MCV 87.0 08/31/2021   PLT 318 08/31/2021   Lab Results  Component Value Date   NA 135 08/31/2021   K 3.5 08/31/2021   CO2 27 08/31/2021   GLUCOSE 187 (H) 08/31/2021   BUN 13 08/31/2021   CREATININE 1.31 (H) 08/31/2021    BILITOT 1.1 08/30/2021   ALKPHOS 51 08/30/2021   AST 29 08/30/2021   ALT 45 (H) 08/30/2021   PROT 7.4 08/30/2021   ALBUMIN 4.2 08/30/2021   CALCIUM 9.8 08/31/2021   ANIONGAP 10 08/31/2021   No results found  for: CHOL No results found for: HDL No results found for: LDLCALC No results found for: TRIG No results found for: [facility] Lab Results  Component Value Date   HGBA1C 5.5 08/29/2021      Assessment & Plan:   1. Primary hypertension/ Heart failure with preserved ejection fraction, unspecified HF chronicity ([place]): BP still not controlled, continue Amlodipine 5, Hydralazine 100 TID, Lisinopril increased to 20 mg and HCTZ 12.5. Plans to maybe switch to [facility] in the future. Continue checking BP at home and follow up in 1 month.  - hydrochlorothiazide (MICROZIDE) 12.5 MG capsule; Take 1 capsule (12.5 mg total) by mouth daily.  Dispense: 90 capsule; Refill: 3  2. Episodic tension-type headache, not intractable: Improving with more controlled BP.  3. Allergy to shellfish: Epi-pen sent to pharmacy to have in case of allergic reaction.   - EPINEPHrine 0.3 mg/0.3 mL IJ SOAJ injection; Inject 0.3 mg into the muscle once for 1 dose.  Dispense: 1 each; Refill: 0  4. Gastroesophageal reflux disease, unspecified whether esophagitis present: PPI refilled.   - pantoprazole (PROTONIX) 40 MG tablet; Take 1 tablet (40 mg total) by mouth daily.  Dispense: 90 tablet; Refill: 3   Follow-up: Return in about 4 weeks (around 11/16/2021).    Teodora Medici, DO

## 2021-10-25 ENCOUNTER — Ambulatory Visit: Payer: Managed Care, Other (non HMO) | Admitting: Cardiology

## 2021-11-06 ENCOUNTER — Ambulatory Visit (HOSPITAL_BASED_OUTPATIENT_CLINIC_OR_DEPARTMENT_OTHER): Payer: Managed Care, Other (non HMO) | Admitting: Cardiovascular Disease

## 2021-11-06 ENCOUNTER — Encounter (HOSPITAL_BASED_OUTPATIENT_CLINIC_OR_DEPARTMENT_OTHER): Payer: Self-pay | Admitting: Cardiovascular Disease

## 2021-11-06 ENCOUNTER — Other Ambulatory Visit: Payer: Self-pay

## 2021-11-06 VITALS — BP 150/76 | HR 71 | Ht 76.0 in | Wt 350.5 lb

## 2021-11-06 DIAGNOSIS — I10 Essential (primary) hypertension: Secondary | ICD-10-CM

## 2021-11-06 DIAGNOSIS — R0681 Apnea, not elsewhere classified: Secondary | ICD-10-CM

## 2021-11-06 DIAGNOSIS — Z5181 Encounter for therapeutic drug level monitoring: Secondary | ICD-10-CM

## 2021-11-06 DIAGNOSIS — R0683 Snoring: Secondary | ICD-10-CM | POA: Diagnosis not present

## 2021-11-06 DIAGNOSIS — E66813 Obesity, class 3: Secondary | ICD-10-CM

## 2021-11-06 DIAGNOSIS — I5032 Chronic diastolic (congestive) heart failure: Secondary | ICD-10-CM | POA: Diagnosis not present

## 2021-11-06 DIAGNOSIS — Z006 Encounter for examination for normal comparison and control in clinical research program: Secondary | ICD-10-CM

## 2021-11-06 DIAGNOSIS — I1A0 Resistant hypertension: Secondary | ICD-10-CM

## 2021-11-06 HISTORY — DX: Apnea, not elsewhere classified: R06.81

## 2021-11-06 MED ORDER — OLMESARTAN-AMLODIPINE-HCTZ 40-10-25 MG PO TABS: 1.0000 | ORAL_TABLET | Freq: Every day | ORAL | 3 refills | Status: DC

## 2021-11-06 NOTE — Progress Notes (Signed)
Advanced Hypertension Clinic Initial Assessment:    Date:  11/06/2021   ID:  Kevin Morris, DOB 31-Jan-1981, MRN [id number]  PCP:  Margarita Mail, DO  Cardiologist:  None  Nephrologist:  Referring MD: Rolly Salter, MD   CC: Hypertension  History of Present Illness:    Kevin Morris is a 41 y.o. male with a hx of chronic diastolic heart failure, hypertension, obesity, and OSA, here to establish care in the Advanced Hypertension Clinic. He was admitted 08/2021 with acute on chronic diastolic heart failure. Echo on admission showed LVEF 55-60% and moderate LVH. He had asymmetric hypertrophy in the inferolateral regions. Diastolic function was indeterminate. He had renal artery dopplers that were negative. He was diuresed with IV lasix and amlodipine was added to his regimen. They increased hydralazine and gave him labetalol as needed. AM cortisol was low at 5.9, renin and aldosterone levels were normal. They recommended an outpatient sleep study.  Today, he is feeling well overall. He was unaware of having hypertension until he was admitted to the hospital in 08/2021. At home, his blood pressure cuff fits tightly on his arm, and his readings average in the 160s-180s/70s-80s. In the past 1-2 weeks he has noticed "slight" chest pains while he was lying down at night. He began to notice the discomfort when he was nearly asleep, and it was still present when he woke up. For exercise he lifts weights and includes a little cardio. Generally he feels well while exercising. He is a former Engineer, technical sales, and notes that he plans to increase his exercise within the next month. Concerning his diet he tries to avoid salt, in favor of alternatives like [employer]. He makes sure to eat fresh produce, and rarely orders out. He does not have caffeine in his diet, and alcohol consumption is occasional. He endorses snoring, and he will sometimes wake up gasping for air or experience night sweats.  His insurance will not cover a sleep study. He denies any palpitations, or shortness of breath. No lightheadedness, headaches, syncope, orthopnea, lower extremity edema or exertional symptoms.    Past Medical History:  Diagnosis Date   Apnea 11/06/2021   CHF (congestive heart failure) (H[facility])    Generalized headaches    GERD (gastroesophageal reflux disease)    Hypertension     Past Surgical History:  Procedure Laterality Date   ACHILLES TENDON SURGERY Right    KNEE ARTHROSCOPY Bilateral    ORIF FINGER / THUMB FRACTURE Bilateral    SHOULDER ARTHROSCOPY WITH LABRAL REPAIR Right     Current Medications: Current Meds  Medication Sig   albuterol (VENTOLIN HFA) 108 (90 Base) MCG/ACT inhaler INHALE 2 PUFFS INTO THE LUNGS EVERY 6 HOURS AS NEEDED FOR WHEEZING OR SHORTNESS OF BREATH   aspirin EC 81 MG EC tablet Take 1 tablet (81 mg total) by mouth daily. Swallow whole.   Olmesartan-amLODIPine-HCTZ 40-10-25 MG TABS Take 1 tablet by mouth daily.   pantoprazole (PROTONIX) 40 MG tablet Take 1 tablet (40 mg total) by mouth daily.   tiZANidine (ZANAFLEX) 4 MG tablet Take 1 tablet (4 mg total) by mouth every 6 (six) hours as needed for muscle spasms.   [DISCONTINUED] amLODipine (NORVASC) 5 MG tablet Take 1 tablet (5 mg total) by mouth daily.   [DISCONTINUED] hydrALAZINE (APRESOLINE) 100 MG tablet TAKE 1 TABLET(100 MG) BY MOUTH THREE TIMES DAILY   [DISCONTINUED] hydrochlorothiazide (MICROZIDE) 12.5 MG capsule Take 1 capsule (12.5 mg total) by mouth daily.   [DISCONTINUED] lisinopril (ZESTRIL)  10 MG tablet Take 10 mg by mouth daily.     Allergies:   Iodinated contrast media and Shellfish-derived products   Social History   Socioeconomic History   Marital status: Single    Spouse name: Not on file   Number of children: Not on file   Years of education: Not on file   Highest education level: Not on file  Occupational History   Not on file  Tobacco Use   Smoking status: Some Days    Types:  Cigars   Smokeless tobacco: Not on file   Tobacco comments:    Very occasional cigar use  Vaping Use   Vaping Use: Never used  Substance and Sexual Activity   Alcohol use: Yes    Comment: occassional   Drug use: Never   Sexual activity: Yes  Other Topics Concern   Not on file  Social History Narrative   Not on file   Social Determinants of Health   Financial Resource Strain: Not on file  Food Insecurity: No Food Insecurity   Worried About Running Out of Food in the Last Year: Never true   Ran Out of Food in the Last Year: Never true  Transportation Needs: No Transportation Needs   Lack of Transportation (Medical): No   Lack of Transportation (Non-Medical): No  Physical Activity: Sufficiently Active   Days of Exercise per Week: 4 days   Minutes of Exercise per Session: 130 min  Stress: Not on file  Social Connections: Not on file     Family History: The patient's family history includes Cancer in his maternal grandmother; Diabetes in his maternal grandmother; Hypertension in his mother; Stroke in his maternal grandmother.  ROS:   Please see the history of present illness.    (+) Snoring (+) PND (+) Night sweats (+) Chest pain All other systems reviewed and are negative.  EKGs/Labs/Other Studies Reviewed:    Renal Artery Dopplers 08/31/2021: IMPRESSION: Negative for renal artery stenosis by duplex criteria.   No acute renal abnormality.  Echo 08/30/2021: Sonographer Comments: Suboptimal apical window and no subcostal window.  IMPRESSIONS    1. Left ventricular ejection fraction, by estimation, is 55 to 60%. The  left ventricle has normal function. The left ventricle has no regional  wall motion abnormalities. There is moderate asymmetric left ventricular  hypertrophy of the mid to distal  inferior and lateral segments. Left ventricular diastolic parameters are  indeterminate.   2. Right ventricular systolic function is normal. The right ventricular  size is  normal.   3. Left atrial size was moderately dilated.   4. The mitral valve is normal in structure. Mild mitral valve  regurgitation. No evidence of mitral stenosis.   5. The aortic valve was not well visualized. Aortic valve regurgitation  is not visualized. Aortic valve sclerosis is present, with no evidence of  aortic valve stenosis.   6. The inferior vena cava is normal in size with greater than 50%  respiratory variability, suggesting right atrial pressure of 3 mmHg.   EKG:   11/06/2021: EKG was not ordered.  Recent Labs: 08/29/2021: B Natriuretic Peptide 251.6; TSH 2.219 08/30/2021: ALT 45 08/31/2021: BUN 13; Creatinine, Ser 1.31; Hemoglobin 16.6; Magnesium 1.9; Platelets 318; Potassium 3.5; Sodium 135   Recent Lipid Panel No results found for: CHOL, TRIG, HDL, CHOLHDL, VLDL, LDLCALC, LDLDIRECT  Physical Exam:    VS:  BP (!) 150/76 (BP Location: Left Wrist, Patient Position: Sitting, Cuff Size: Normal)    Pulse 71  Ht 6\' 4"  (1.93 m)    Wt (!) 350 lb 8 oz (159 kg)    SpO2 97%    BMI 42.66 kg/m  , BMI Body mass index is 42.66 kg/m. GENERAL:  Well appearing HEENT: Pupils equal round and reactive, fundi not visualized, oral mucosa unremarkable NECK:  No jugular venous distention, waveform within normal limits, carotid upstroke brisk and symmetric, no bruits, no thyromegaly LUNGS:  Clear to auscultation bilaterally HEART:  RRR.  PMI not displaced or sustained,S1 and S2 within normal limits, no S3, no S4, no clicks, no rubs, no murmurs ABD:  Flat, positive bowel sounds normal in frequency in pitch, no bruits, no rebound, no guarding, no midline pulsatile mass, no hepatomegaly, no splenomegaly EXT:  2 plus pulses throughout, no edema, no cyanosis no clubbing SKIN:  No rashes no nodules NEURO:  Cranial nerves II through XII grossly intact, motor grossly intact throughout PSYCH:  Cognitively intact, oriented to person place and time   ASSESSMENT/PLAN:    Resistant  hypertension Blood pressure is poorly controlled on multiple agents.  He is on moderate doses of multiple medicines.  With the hydralazine his regimen is somewhat difficult to manage.  We will stop amlodipine, hydralazine, HCTZ, and lisinopril.  We will switch him to Tribenzor (amlodipine/HCTZ/olmesartan 08/01/39 mg) daily and check a basic metabolic panel in a week.  He is going to continue with his diet and exercise changes.  He was congratulated for his low-sodium diet.  Recommended that he increase the cardio to help with weight loss and limit carbohydrate intake.  He is interested in enrolling in our remote patient monitoring study and consents to monitoring in the [employer] remote patient monitoring system.  Based on his symptoms he likely has sleep apnea.  He has not been able to get this covered by his insurance in the past.  We will try again.  Chronic diastolic heart failure (HCC) He had hypertrophy and was admitted with volume overload.  Symptoms have stabilized and he is euvolemic on exam today.  Blood pressure management as above.  Apnea Mr. Pennisi has snoring and apnea.  He also has daytime somnolence.  We will try to help him with getting a sleep study as above.  Obesity, Class III, BMI 40-49.9 (morbid obesity) (HCC) Discussed working on weight loss by increasing cardio and limiting carbohydrate intake.  He expressed understanding.   Screening for Secondary Hypertension:  Causes 11/06/2021  Drugs/Herbals Screened     - Comments limiting sodium, no caffeine, rare EtOH  Sleep Apnea Screened     - Comments snores and has apnea.  Will get sleep study/    Relevant Labs/Studies: Basic Labs Latest Ref Rng & Units 08/31/2021 08/30/2021 08/29/2021  Sodium 135 - 145 mmol/L 135 136 -  Potassium 3.5 - 5.1 mmol/L 3.5 3.8 -  Creatinine 0.61 - 1.24 mg/dL 08/31/2021) 0.98(J 1.91    Thyroid  Latest Ref Rng & Units 08/29/2021  TSH 0.350 - 4.500 uIU/mL 2.219    Renin/Aldosterone  Latest Ref Rng &  Units 08/30/2021  Aldosterone 0.0 - 30.0 ng/dL 1.1     Disposition:    FU with APP/PharmD in 1 month.   FU with Janille Draughon C. 09/01/2021, MD, [facility] in 4 months.   Medication Adjustments/Labs and Tests Ordered: Current medicines are reviewed at length with the patient today.  Concerns regarding medicines are outlined above.   Orders Placed This Encounter  Procedures   Basic metabolic panel   Cantrils Ladder Assessment  Split night study   Meds ordered this encounter  Medications   Olmesartan-amLODIPine-HCTZ 40-10-25 MG TABS    Sig: Take 1 tablet by mouth daily.    Dispense:  90 tablet    Refill:  3    D/C AMLODIPINE, HCTZ, HYDRALAZINE, AND LISINOPRIL   I,Mathew Stumpf,acting as a scribe for Chilton Siiffany [place], MD.,have documented all relevant documentation on the behalf of Chilton Siiffany [place], MD,as directed by  Chilton Siiffany [place], MD while in the presence of Chilton Siiffany [place], MD.  I, Brooklyne Radke C. Duke Salviaandolph, MD have reviewed all documentation for this visit.  The documentation of the exam, diagnosis, procedures, and orders on 11/06/2021 are all accurate and complete.   Signed, Chilton Siiffany [place], MD  11/06/2021 5:15 PM    [facility]

## 2021-11-06 NOTE — Assessment & Plan Note (Signed)
Kevin Morris has snoring and apnea.  He also has daytime somnolence.  We will try to help him with getting a sleep study as above.

## 2021-11-06 NOTE — Assessment & Plan Note (Addendum)
Blood pressure is poorly controlled on multiple agents.  He is on moderate doses of multiple medicines.  With the hydralazine his regimen is somewhat difficult to manage.  We will stop amlodipine, hydralazine, HCTZ, and lisinopril.  We will switch him to Tribenzor (amlodipine/HCTZ/olmesartan 08/01/39 mg) daily and check a basic metabolic panel in a week.  He is going to continue with his diet and exercise changes.  He was congratulated for his low-sodium diet.  Recommended that he increase the cardio to help with weight loss and limit carbohydrate intake.  He is interested in enrolling in our remote patient monitoring study and consents to monitoring in the [employer] remote patient monitoring system.  Based on his symptoms he likely has sleep apnea.  He has not been able to get this covered by his insurance in the past.  We will try again.

## 2021-11-06 NOTE — Assessment & Plan Note (Signed)
He had hypertrophy and was admitted with volume overload.  Symptoms have stabilized and he is euvolemic on exam today.  Blood pressure management as above.

## 2021-11-06 NOTE — Patient Instructions (Addendum)
Medication Instructions:  STOP AMLODIPINE, HYDRALAZINE, LISINOPRIL, AND HYDROCHLOROTHIAZIDE   START OLMESARTAN-AMLODIPINE-HCTZ 40-10-25 MG DAILY    Labwork: BMET IN 1 WEEK    Testing/Procedures: Your physician has recommended that you have a sleep study. This test records several body functions during sleep, including: brain activity, eye movement, oxygen and carbon dioxide blood levels, heart rate and rhythm, breathing rate and rhythm, the flow of air through your mouth and nose, snoring, body muscle movements, and chest and belly movement. THE OFFICE WILL CALL YOU ONCE YOUR INSURANCE HAS BEEN REVIEWED TO SCHEDULE    Follow-Up: 03/18/2022 AT 8:15 AM WITH DR Ravanna ([facility] LOCATION)  01/08/2022 8:00 AM WITH PHARM D ([facility])   Special Instructions:  MONITOR BLOOD PRESSURE TWICE A DAY WITH MACHINE PROVIDED, MAKE SURE YOU ARE LOGGED INTO [employer] APP  DASH Eating Plan DASH stands for "Dietary Approaches to Stop Hypertension." The DASH eating plan is a healthy eating plan that has been shown to reduce high blood pressure (hypertension). It may also reduce your risk for type 2 diabetes, heart disease, and stroke. The DASH eating plan may also help with weight loss. What are tips for following this plan?  General guidelines Avoid eating more than 2,300 mg (milligrams) of salt (sodium) a day. If you have hypertension, you may need to reduce your sodium intake to 1,500 mg a day. Limit alcohol intake to no more than 1 drink a day for nonpregnant women and 2 drinks a day for men. One drink equals 12 oz of beer, 5 oz of wine, or 1 oz of hard liquor. Work with your health care provider to maintain a healthy body weight or to lose weight. Ask what an ideal weight is for you. Get at least 30 minutes of exercise that causes your heart to beat faster (aerobic exercise) most days of the week. Activities may include walking, swimming, or biking. Work with your health care provider or diet and  nutrition specialist (dietitian) to adjust your eating plan to your individual calorie needs. Reading food labels  Check food labels for the amount of sodium per serving. Choose foods with less than 5 percent of the Daily Value of sodium. Generally, foods with less than 300 mg of sodium per serving fit into this eating plan. To find whole grains, look for the word "whole" as the first word in the ingredient list. Shopping Buy products labeled as "low-sodium" or "no salt added." Buy fresh foods. Avoid canned foods and premade or frozen meals. Cooking Avoid adding salt when cooking. Use salt-free seasonings or herbs instead of table salt or sea salt. Check with your health care provider or pharmacist before using salt substitutes. Do not fry foods. Cook foods using healthy methods such as baking, boiling, grilling, and broiling instead. Cook with heart-healthy oils, such as olive, canola, soybean, or sunflower oil. Meal planning Eat a balanced diet that includes: 5 or more servings of fruits and vegetables each day. At each meal, try to fill half of your plate with fruits and vegetables. Up to 6-8 servings of whole grains each day. Less than 6 oz of lean meat, poultry, or fish each day. A 3-oz serving of meat is about the same size as a deck of cards. One egg equals 1 oz. 2 servings of low-fat dairy each day. A serving of nuts, seeds, or beans 5 times each week. Heart-healthy fats. Healthy fats called Omega-3 fatty acids are found in foods such as flaxseeds and coldwater fish, like sardines, salmon, and  mackerel. Limit how much you eat of the following: Canned or prepackaged foods. Food that is high in trans fat, such as fried foods. Food that is high in saturated fat, such as fatty meat. Sweets, desserts, sugary drinks, and other foods with added sugar. Full-fat dairy products. Do not salt foods before eating. Try to eat at least 2 vegetarian meals each week. Eat more home-cooked food and  less restaurant, buffet, and fast food. When eating at a restaurant, ask that your food be prepared with less salt or no salt, if possible. What foods are recommended? The items listed may not be a complete list. Talk with your dietitian about what dietary choices are best for you. Grains Whole-grain or whole-wheat bread. Whole-grain or whole-wheat pasta. Brown rice. Orpah Cobb. Bulgur. Whole-grain and low-sodium cereals. Pita bread. Low-fat, low-sodium crackers. Whole-wheat flour tortillas. Vegetables Fresh or frozen vegetables (raw, steamed, roasted, or grilled). Low-sodium or reduced-sodium tomato and vegetable juice. Low-sodium or reduced-sodium tomato sauce and tomato paste. Low-sodium or reduced-sodium canned vegetables. Fruits All fresh, dried, or frozen fruit. Canned fruit in natural juice (without added sugar). Meat and other protein foods Skinless chicken or [place]. Ground chicken or [place]. Pork with fat trimmed off. Fish and seafood. Egg whites. Dried beans, peas, or lentils. Unsalted nuts, nut butters, and seeds. Unsalted canned beans. Lean cuts of beef with fat trimmed off. Low-sodium, lean deli meat. Dairy Low-fat (1%) or fat-free (skim) milk. Fat-free, low-fat, or reduced-fat cheeses. Nonfat, low-sodium ricotta or cottage cheese. Low-fat or nonfat yogurt. Low-fat, low-sodium cheese. Fats and oils Soft margarine without trans fats. Vegetable oil. Low-fat, reduced-fat, or light mayonnaise and salad dressings (reduced-sodium). Canola, safflower, olive, soybean, and sunflower oils. Avocado. Seasoning and other foods Herbs. Spices. Seasoning mixes without salt. Unsalted popcorn and pretzels. Fat-free sweets. What foods are not recommended? The items listed may not be a complete list. Talk with your dietitian about what dietary choices are best for you. Grains Baked goods made with fat, such as croissants, muffins, or some breads. Dry pasta or rice meal packs. Vegetables Creamed  or fried vegetables. Vegetables in a cheese sauce. Regular canned vegetables (not low-sodium or reduced-sodium). Regular canned tomato sauce and paste (not low-sodium or reduced-sodium). Regular tomato and vegetable juice (not low-sodium or reduced-sodium). Rosita Fire. Olives. Fruits Canned fruit in a light or heavy syrup. Fried fruit. Fruit in cream or butter sauce. Meat and other protein foods Fatty cuts of meat. Ribs. Fried meat. Tomasa Blase. Sausage. Bologna and other processed lunch meats. Salami. Fatback. Hotdogs. Bratwurst. Salted nuts and seeds. Canned beans with added salt. Canned or smoked fish. Whole eggs or egg yolks. Chicken or [place] with skin. Dairy Whole or 2% milk, cream, and half-and-half. Whole or full-fat cream cheese. Whole-fat or sweetened yogurt. Full-fat cheese. Nondairy creamers. Whipped toppings. Processed cheese and cheese spreads. Fats and oils Butter. Stick margarine. Lard. Shortening. Ghee. Bacon fat. Tropical oils, such as coconut, palm kernel, or palm oil. Seasoning and other foods Salted popcorn and pretzels. Onion salt, garlic salt, seasoned salt, table salt, and sea salt. Worcestershire sauce. Tartar sauce. Barbecue sauce. Teriyaki sauce. Soy sauce, including reduced-sodium. Steak sauce. Canned and packaged gravies. Fish sauce. Oyster sauce. Cocktail sauce. Horseradish that you find on the shelf. Ketchup. Mustard. Meat flavorings and tenderizers. Bouillon cubes. Hot sauce and Tabasco sauce. Premade or packaged marinades. Premade or packaged taco seasonings. Relishes. Regular salad dressings. Where to find more information: National Heart, Lung, and Blood Institute: PopSteam.is American Heart Association: www.heart.org Summary The DASH  eating plan is a healthy eating plan that has been shown to reduce high blood pressure (hypertension). It may also reduce your risk for type 2 diabetes, heart disease, and stroke. With the DASH eating plan, you should limit salt (sodium)  intake to 2,300 mg a day. If you have hypertension, you may need to reduce your sodium intake to 1,500 mg a day. When on the DASH eating plan, aim to eat more fresh fruits and vegetables, whole grains, lean proteins, low-fat dairy, and heart-healthy fats. Work with your health care provider or diet and nutrition specialist (dietitian) to adjust your eating plan to your individual calorie needs. This information is not intended to replace advice given to you by your health care provider. Make sure you discuss any questions you have with your health care provider. Document Released: 09/12/2011 Document Revised: 09/05/2017 Document Reviewed: 09/16/2016 Elsevier Patient Education  2020 [employer]

## 2021-11-06 NOTE — Research (Signed)
Subject Name: Kevin Morris met inclusion and exclusion criteria for the Virtual Care and Social Determinant Interventions for the management of hypertension trial.  The informed consent form, study requirements and expectations were reviewed with the subject by Dr. Oval Linsey and myself. The subject was given the opportunity to read the consent and ask questions. The subject verbalized understanding of the trial requirements.  All questions were addressed prior to the signing of the consent form. The subject agreed to participate in the trial and signed the informed consent. The informed consent was obtained prior to performance of any protocol-specific procedures for the subject.  A copy of the signed informed consent was given to the subject and a copy was placed in the subject's medical record.  Kevin Morris was randomized to Group 2.

## 2021-11-06 NOTE — Assessment & Plan Note (Signed)
Discussed working on weight loss by increasing cardio and limiting carbohydrate intake.  He expressed understanding.

## 2021-11-08 ENCOUNTER — Telehealth: Payer: Self-pay

## 2021-11-08 DIAGNOSIS — Z0000 Encounter for general adult medical examination without abnormal findings: Secondary | ICD-10-CM

## 2021-11-08 NOTE — Telephone Encounter (Signed)
Called patient to determine if he was having connectivity issues during the 24 hour follow up call. Patient stated that he has not set up the device yet because he has not had the time. Patient plans to connect and start checking his blood pressure no later than 2/5. Patient was informed that he would receive a call next week if readings do not show in the portal.     Hillary Struss Nedra Hai, [facility] Guide, Health Coach [address], [address] [place] [address] Telephone: [phone] Email: [email or web address]

## 2021-11-09 ENCOUNTER — Telehealth: Payer: Self-pay | Admitting: *Deleted

## 2021-11-09 NOTE — Telephone Encounter (Signed)
Prior Authorization for split night sleep study sent to [facility] via web portal. Tracking Number [id number].

## 2021-11-11 DIAGNOSIS — I10 Essential (primary) hypertension: Secondary | ICD-10-CM | POA: Diagnosis not present

## 2021-11-14 ENCOUNTER — Telehealth: Payer: Self-pay

## 2021-11-14 DIAGNOSIS — Z0000 Encounter for general adult medical examination without abnormal findings: Secondary | ICD-10-CM

## 2021-11-14 NOTE — Telephone Encounter (Signed)
Called patient to determine if he was having connection issues because his readings are not showing up in the portal. Patient stated that he has not been receiving notifications to check his blood pressure. Patient expressed that he assumed that the readings were transferring to the app.   Put in a ticket with [employer] for further follow up. Will call patient back on 2/13 if issue is not resolved by then.   Markel Kurtenbach Nedra Hai, [facility] Guide, Health Coach [address], [address] [place] [address] Telephone: [phone] Email: [email or web address]

## 2021-11-19 ENCOUNTER — Ambulatory Visit: Payer: Managed Care, Other (non HMO) | Admitting: Internal Medicine

## 2021-11-19 ENCOUNTER — Other Ambulatory Visit: Payer: Self-pay

## 2021-11-19 ENCOUNTER — Encounter: Payer: Self-pay | Admitting: Internal Medicine

## 2021-11-19 VITALS — BP 148/98 | HR 82 | Temp 98.3°F | Resp 16 | Ht 76.0 in | Wt 350.8 lb

## 2021-11-19 DIAGNOSIS — I10 Essential (primary) hypertension: Secondary | ICD-10-CM

## 2021-11-19 DIAGNOSIS — I209 Angina pectoris, unspecified: Secondary | ICD-10-CM | POA: Diagnosis not present

## 2021-11-19 DIAGNOSIS — I5030 Unspecified diastolic (congestive) heart failure: Secondary | ICD-10-CM | POA: Diagnosis not present

## 2021-11-19 MED ORDER — TIZANIDINE HCL 4 MG PO TABS: 4.0000 mg | ORAL_TABLET | Freq: Four times a day (QID) | ORAL | 0 refills | Status: DC | PRN

## 2021-11-19 NOTE — Patient Instructions (Addendum)
It was great seeing you today!  Plan discussed at today's visit: -EKG today -Follow up with Cardiology -EKG here reassuring but if chest pain increases or changes, please present to the emergency department    Follow up in: 3 months   Take care and let us know if you have any questions or concerns prior to your next visit.  Dr. Rosana Berger

## 2021-11-19 NOTE — Progress Notes (Signed)
Established Patient Office Visit  Subjective:  Patient ID: Kevin Morris, male    DOB: 09-16-1981  Age: 41 y.o. MRN: [id number]  CC:  Chief Complaint  Patient presents with   Follow-up   Hypertension   Gastroesophageal Reflux    HPI Kevin Morris presents for BP follow up.   Hypertension: -Now following with HTN clinic -Medications: Amlodipine-HCTZ-Olmesartan 08-[birth year]-39   -Patient is compliant with above medications and reports no side effects. -Checking BP at home (average): is checking at home - 140-150/90's  -Denies any SOB, vision changes, LE edema or symptoms of hypotension, headaches improving -Diet: decreasing sodium in the diet -Exercise: trying to start working out again with weights, had been a Contractor in the past -Cardiology new patient appointment coming up  CHEST PAIN Time since onset: Duration: first started a couple weeks ago and resolved but began again last week Onset:  uncertain Quality: pulling Location: upper sternum Radiation:  feels like radiating into throat but denies acid reflux symptoms, on PPI Episode duration: continuous  Frequency: constant Related to exertion: no Activity when pain started: uncertain  Trauma: no Anxiety/recent stressors: no Aggravating factors: swallowing, sometimes lifting Alleviating factors: certain positions, like sitting down Status: fluctuating Treatments attempted: PPI, muscle relaxer Current pain status: pain free Shortness of breath: no Cough: no Nausea: no Diaphoresis: no Heartburn: no Palpitations: no  HFpEF: -Last echo 08/30/21 - EF 55-60% without wall motion abnormalities or valvular pathology and LVH -Following up with Cardiology later this week   GERD: -Currently on Pantoprazole 40 mg which has resolved his symptoms    Headaches: -Decreasing in frequency with better controlled BP -Sleep study ordered    Past Medical History:  Diagnosis Date   Apnea 11/06/2021   CHF  (congestive heart failure) (HCC)    Generalized headaches    GERD (gastroesophageal reflux disease)    Hypertension     Past Surgical History:  Procedure Laterality Date   ACHILLES TENDON SURGERY Right    KNEE ARTHROSCOPY Bilateral    ORIF FINGER / THUMB FRACTURE Bilateral    SHOULDER ARTHROSCOPY WITH LABRAL REPAIR Right     Family History  Problem Relation Age of Onset   Hypertension Mother    Cancer Maternal Grandmother        breast   Diabetes Maternal Grandmother    Stroke Maternal Grandmother     Social History   Socioeconomic History   Marital status: Single    Spouse name: Not on file   Number of children: Not on file   Years of education: Not on file   Highest education level: Not on file  Occupational History   Not on file  Tobacco Use   Smoking status: Some Days    Types: Cigars   Smokeless tobacco: Not on file   Tobacco comments:    Very occasional cigar use  Vaping Use   Vaping Use: Never used  Substance and Sexual Activity   Alcohol use: Yes    Comment: occassional   Drug use: Never   Sexual activity: Yes  Other Topics Concern   Not on file  Social History Narrative   Not on file   Social Determinants of Health   Financial Resource Strain: Not on file  Food Insecurity: No Food Insecurity   Worried About Running Out of Food in the Last Year: Never true   Ran Out of Food in the Last Year: Never true  Transportation Needs: No Transportation Needs   Lack of Transportation (  Medical): No   Lack of Transportation (Non-Medical): No  Physical Activity: Sufficiently Active   Days of Exercise per Week: 4 days   Minutes of Exercise per Session: 130 min  Stress: Not on file  Social Connections: Not on file  Intimate Partner Violence: Not on file    Outpatient Medications Prior to Visit  Medication Sig Dispense Refill   albuterol (VENTOLIN HFA) 108 (90 Base) MCG/ACT inhaler INHALE 2 PUFFS INTO THE LUNGS EVERY 6 HOURS AS NEEDED FOR WHEEZING OR  SHORTNESS OF BREATH 24 g 0   aspirin EC 81 MG EC tablet Take 1 tablet (81 mg total) by mouth daily. Swallow whole. 30 tablet 11   Olmesartan-amLODIPine-HCTZ 40-10-25 MG TABS Take 1 tablet by mouth daily. 90 tablet 3   pantoprazole (PROTONIX) 40 MG tablet Take 1 tablet (40 mg total) by mouth daily. 90 tablet 3   tiZANidine (ZANAFLEX) 4 MG tablet Take 1 tablet (4 mg total) by mouth every 6 (six) hours as needed for muscle spasms. 30 tablet 0   No facility-administered medications prior to visit.    Allergies  Allergen Reactions   Iodinated Contrast Media Anaphylaxis   Shellfish-Derived Products Anaphylaxis    ROS Review of Systems  Constitutional:  Negative for chills, diaphoresis and fever.  Eyes:  Negative for visual disturbance.  Respiratory:  Negative for shortness of breath.   Cardiovascular:  Positive for chest pain. Negative for palpitations and leg swelling.  Gastrointestinal:  Negative for abdominal pain, nausea and vomiting.  Neurological:  Negative for dizziness and headaches.     Objective:    Physical Exam Constitutional:      Appearance: Normal appearance.  HENT:     Head: Normocephalic and atraumatic.  Eyes:     Conjunctiva/sclera: Conjunctivae normal.  Cardiovascular:     Rate and Rhythm: Normal rate and regular rhythm.  Pulmonary:     Effort: Pulmonary effort is normal.     Breath sounds: Normal breath sounds.  Musculoskeletal:     Right lower leg: No edema.     Left lower leg: No edema.  Skin:    General: Skin is warm and dry.  Neurological:     General: No focal deficit present.     Mental Status: He is alert. Mental status is at baseline.  Psychiatric:        Mood and Affect: Mood normal.        Behavior: Behavior normal.    BP (!) 148/98    Pulse 82    Temp 98.3 F (36.8 C)    Resp 16    Ht 6\' 4"  (1.93 m)    Wt (!) 350 lb 12.8 oz (159.1 kg)    SpO2 94%    BMI 42.70 kg/m  Wt Readings from Last 3 Encounters:  11/19/21 (!) 350 lb 12.8 oz (159.1  kg)  11/06/21 (!) 350 lb 8 oz (159 kg)  10/19/21 (!) 352 lb 3.2 oz (159.8 kg)     Health Maintenance Due  Topic Date Due   COVID-19 Vaccine (1) Never done   Hepatitis C Screening  Never done    There are no preventive care reminders to display for this patient.  Lab Results  Component Value Date   TSH 2.219 08/29/2021   Lab Results  Component Value Date   WBC 7.6 08/31/2021   HGB 16.6 08/31/2021   HCT 47.4 08/31/2021   MCV 87.0 08/31/2021   PLT 318 08/31/2021   Lab Results  Component Value Date  NA 135 08/31/2021   K 3.5 08/31/2021   CO2 27 08/31/2021   GLUCOSE 187 (H) 08/31/2021   BUN 13 08/31/2021   CREATININE 1.31 (H) 08/31/2021   BILITOT 1.1 08/30/2021   ALKPHOS 51 08/30/2021   AST 29 08/30/2021   ALT 45 (H) 08/30/2021   PROT 7.4 08/30/2021   ALBUMIN 4.2 08/30/2021   CALCIUM 9.8 08/31/2021   ANIONGAP 10 08/31/2021   No results found for: CHOL No results found for: HDL No results found for: LDLCALC No results found for: TRIG No results found for: [facility] Lab Results  Component Value Date   HGBA1C 5.5 08/29/2021      Assessment & Plan:   1. Primary hypertension: BP better controlled, following with HTN clinic.  2. Heart failure with preserved ejection fraction, unspecified HF chronicity ([place]): Stable, seeing Cardiology later this week.  3. Angina pectoris ([place]): EKG in the office showing sinus bradycardia without ST changes, no EKGS to compare. Chest pain appears to be due to muscle strain, muscle relaxer refilled. Discussed if symptoms worsen to present to the ER.   - EKG 12-Lead - tiZANidine (ZANAFLEX) 4 MG tablet; Take 1 tablet (4 mg total) by mouth every 6 (six) hours as needed for muscle spasms.  Dispense: 30 tablet; Refill: 0   Follow-up: Return in about 3 months (around 02/16/2022).    Teodora Medici, DO

## 2021-11-22 ENCOUNTER — Other Ambulatory Visit: Payer: Self-pay | Admitting: Cardiovascular Disease

## 2021-11-22 ENCOUNTER — Encounter: Payer: Managed Care, Other (non HMO) | Admitting: Cardiology

## 2021-11-22 ENCOUNTER — Other Ambulatory Visit: Payer: Self-pay

## 2021-11-22 ENCOUNTER — Encounter: Payer: Self-pay | Admitting: Cardiology

## 2021-11-22 DIAGNOSIS — R0681 Apnea, not elsewhere classified: Secondary | ICD-10-CM

## 2021-11-22 DIAGNOSIS — I5032 Chronic diastolic (congestive) heart failure: Secondary | ICD-10-CM

## 2021-11-22 NOTE — Progress Notes (Deleted)
Primary Care Provider: Teodora Medici, DO [facility] Cardiologist: None -> seen by Dr. Skeet Latch on November 06, 2021 [facility] CHF Clinic: Darylene Price, NP Electrophysiologist: None  Clinic Note: No chief complaint on file.   ===================================  ASSESSMENT/PLAN   Problem List Items Addressed This Visit       Cardiology Problems   Resistant hypertension - Primary   Chronic diastolic heart failure ([place])     Other   Obesity, Class III, BMI 40-49.9 (morbid obesity) ([place])    ===================================  HPI:    Kevin Morris is a morbidly obese 41 y.o. 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 Recent Hospitalizations:  08/26/2021: Admitted with acute on chronic diastolic heart failure.  EF by echo was 55 to 60% with moderate LVH (asymmetric hypertrophy of the inferolateral wall). ->  IV diuresis.  Amlodipine added to existing hydralazine which was increased.  As needed labetalol.   [name] was seen on November 06, 2021 by Dr. Oval Linsey to establish care in the Advanced Hypertension Failure. => He notes that home BPs run from 160s to 180s/70s to 80s.  Had noticed a couple episodes of chest pain with laying down at night.  Lasted all night.  Lifts weights but no cardio.  Feels well with exercise.  Trying to avoid salt.  Does not drink caffeine and only occasional alcohol.  He does snore at son's wakes up gasping for air.  Apparently sleep study was not covered by insurance. Stopped amlodipine, hydralazine, HCTZ and lisinopril.  Converted to Tribenzor (amlodipine/HCTZ/olmesartan 08/01/39 mg daily with plan for follow-up labs in a week.  Continue dietary modification.  Increase cardio exercise and limit carbohydrate intake. -<Plan was to enroll in the patient monitoring study-Verify Remote.  Also  planned to reorder sleep study. Noted to be euvolemic.   Reviewed  CV studies:    The following studies were reviewed today: (if available, images/films reviewed: From Epic Chart or Care Everywhere) TTE 08/30/2021: EF 55 to 60%.  No RWMA.  Moderate asymmetric LVH and mid to distal inferior lateral segments.  Indeterminate diastolic parameters.  Moderate LA dilation.  Aortic valve sclerosis with no stenosis.  Normal RAP.:   Interval History:   Kevin Morris   CV Review of Symptoms (Summary) Cardiovascular ROS: {roscv:310661}  REVIEWED OF SYSTEMS   ROS  I have reviewed and (if needed) personally updated the patient's problem list, medications, allergies, past medical and surgical history, social and family history.   PAST MEDICAL HISTORY   Past Medical History:  Diagnosis Date   Apnea 11/06/2021   CHF (congestive heart failure) (HCC)    Generalized headaches    GERD (gastroesophageal reflux disease)    Hypertension     PAST SURGICAL HISTORY   Past Surgical History:  Procedure Laterality Date   ACHILLES TENDON SURGERY Right    KNEE ARTHROSCOPY Bilateral    ORIF FINGER / THUMB FRACTURE Bilateral    SHOULDER ARTHROSCOPY WITH LABRAL REPAIR Right     Immunization History  Administered Date(s) Administered   Influenza,inj,Quad PF,6+ Mos 09/19/2021   Tdap 09/19/2021    MEDICATIONS/ALLERGIES   No outpatient medications have been marked as taking for the 11/22/21 encounter (Appointment) with Leonie Man, MD.    Allergies  Allergen Reactions   Iodinated Contrast Media Anaphylaxis   Shellfish-Derived Products Anaphylaxis    SOCIAL HISTORY/FAMILY HISTORY  Reviewed in Epic:   Social History   Tobacco Use   Smoking status: Some Days    Types: Cigars   Tobacco comments:    Very occasional cigar use  Vaping Use   Vaping Use: Never used  Substance Use Topics   Alcohol use: Yes    Comment: occassional   Drug use: Never   Social History   Social  History Narrative   Not on file   Family History  Problem Relation Age of Onset   Hypertension Mother    Cancer Maternal Grandmother        breast   Diabetes Maternal Grandmother    Stroke Maternal Grandmother     OBJCTIVE -PE, EKG, labs   Wt Readings from Last 3 Encounters:  11/19/21 (!) 350 lb 12.8 oz (159.1 kg)  11/06/21 (!) 350 lb 8 oz (159 kg)  10/19/21 (!) 352 lb 3.2 oz (159.8 kg)    Physical Exam: There were no vitals taken for this visit. Physical Exam  Normal   Adult ECG Report  Rate: *** ;  Rhythm: {rhythm:17366};   Narrative Interpretation: ***  Recent Labs:  ***  No results found for: CHOL, HDL, LDLCALC, LDLDIRECT, TRIG, CHOLHDL Lab Results  Component Value Date   CREATININE 1.31 (H) 08/31/2021   BUN 13 08/31/2021   NA 135 08/31/2021   K 3.5 08/31/2021   CL 98 08/31/2021   CO2 27 08/31/2021   CBC Latest Ref Rng & Units 08/31/2021 08/30/2021 08/29/2021  WBC 4.0 - 10.5 K/uL 7.6 5.5 6.1  Hemoglobin 13.0 - 17.0 g/dL 16.6 15.3 15.4  Hematocrit 39.0 - 52.0 % 47.4 43.0 42.8  Platelets 150 - 400 K/uL 318 251 265    Lab Results  Component Value Date   HGBA1C 5.5 08/29/2021   Lab Results  Component Value Date   TSH 2.219 08/29/2021    ==================================================  COVID-19 Education: The signs and symptoms of COVID-19 were discussed with the patient and how to seek care for testing (follow up with PCP or arrange E-visit).    I spent a total of *** minutes with the patient spent in direct patient consultation.  Additional time spent with chart review  / charting (studies, outside notes, etc): *** min Total Time: *** min  Current medicines are reviewed at length with the patient today.  (+/- concerns) ***  This visit occurred during the SARS-CoV-2 public health emergency.  Safety protocols were in place, including screening questions prior to the visit, additional usage of staff PPE, and extensive cleaning of exam room while  observing appropriate contact time as indicated for disinfecting solutions.  Notice: This dictation was prepared with Dragon dictation along with smart phrase technology. Any transcriptional errors that result from this process are unintentional and may not be corrected upon review.   Studies Ordered:  No orders of the defined types were placed in this encounter.   Patient Instructions / Medication Changes & Studies & Tests Ordered   There are no Patient Instructions on file for this visit.    Glenetta Hew, M.D., M.S. Interventional Cardiologist   Pager # [phone] Phone # [phone] [address] [place], [place] [address]   Thank you for choosing [facility] in [place]!!

## 2021-11-22 NOTE — Telephone Encounter (Signed)
Received  a fax from [facility] denying the  split night sleep study. Will re submit for a HST.

## 2021-11-22 NOTE — Telephone Encounter (Signed)
Per [employer] no PA is required for HST. Appointment scheduled and patient notified.

## 2021-11-26 NOTE — Progress Notes (Signed)
This encounter was created in error - please disregard.

## 2021-12-11 ENCOUNTER — Encounter: Payer: Self-pay | Admitting: Family

## 2021-12-11 ENCOUNTER — Other Ambulatory Visit: Payer: Self-pay

## 2021-12-11 ENCOUNTER — Ambulatory Visit: Payer: Managed Care, Other (non HMO) | Attending: Family | Admitting: Family

## 2021-12-11 VITALS — BP 140/64 | HR 67 | Resp 20 | Ht 76.0 in | Wt 356.4 lb

## 2021-12-11 DIAGNOSIS — M542 Cervicalgia: Secondary | ICD-10-CM | POA: Insufficient documentation

## 2021-12-11 DIAGNOSIS — R0683 Snoring: Secondary | ICD-10-CM | POA: Insufficient documentation

## 2021-12-11 DIAGNOSIS — I10 Essential (primary) hypertension: Secondary | ICD-10-CM

## 2021-12-11 DIAGNOSIS — I5032 Chronic diastolic (congestive) heart failure: Secondary | ICD-10-CM | POA: Insufficient documentation

## 2021-12-11 DIAGNOSIS — I110 Hypertensive heart disease with heart failure: Secondary | ICD-10-CM | POA: Diagnosis not present

## 2021-12-11 DIAGNOSIS — G479 Sleep disorder, unspecified: Secondary | ICD-10-CM | POA: Diagnosis not present

## 2021-12-11 DIAGNOSIS — Z79899 Other long term (current) drug therapy: Secondary | ICD-10-CM | POA: Insufficient documentation

## 2021-12-11 MED ORDER — DAPAGLIFLOZIN PROPANEDIOL 10 MG PO TABS: 10.0000 mg | ORAL_TABLET | Freq: Every day | ORAL | 5 refills | Status: DC

## 2021-12-11 NOTE — Progress Notes (Signed)
? Patient ID: Kevin Morris, male    DOB: 1981/05/13, 41 y.o.   MRN: [id number] ? ? ?Kevin Morris is a 41 y/o male with a history of HTN and chronic heart failure.  ? ?Echo report from 08/30/21 reviewed and showed an EF of 55-60% along with moderate LVH/ LAE and mild Kevin.  ? ?Admitted 08/29/21 due to acute on chronic HF. Initially given IV lasix with transition to oral diuretics. Head CT performed due to HTN/ HA and it was without acute findings. HTN meds adjusted. Elevated troponin thought to be due to HTN. Discharged after 2 days.  ? ?Kevin Morris presents today for a follow-up visit with a chief complaint of minimal fatigue upon moderate exertion. Kevin Morris describes this as chronic in nature having been present for several months, but recently improving. Kevin Morris has associated periods of apnea and rare episodes of chest pain along with this. Kevin Morris denies any dizziness, difficulty sleeping, abdominal distention, palpitations, pedal edema, shortness of breath, cough or weight gain.  ? ?Kevin Morris has not been routinely weighing, Kevin Morris is actively working out and has just gotten out of the daily habit. Discussed importance of daily weights and their purpose, Kevin Morris acknowledges and states Kevin Morris is well versed with working out and fluid retention.  ? ?Past Medical History:  ?Diagnosis Date  ? Apnea 11/06/2021  ? CHF (congestive heart failure) (HCC)   ? Generalized headaches   ? GERD (gastroesophageal reflux disease)   ? Hypertension   ? ?Past Surgical History:  ?Procedure Laterality Date  ? ACHILLES TENDON SURGERY Right   ? KNEE ARTHROSCOPY Bilateral   ? ORIF FINGER / THUMB FRACTURE Bilateral   ? SHOULDER ARTHROSCOPY WITH LABRAL REPAIR Right   ? ?Family History  ?Problem Relation Age of Onset  ? Hypertension Mother   ? Cancer Maternal Grandmother   ?     breast  ? Diabetes Maternal Grandmother   ? Stroke Maternal Grandmother   ? ?Social History  ? ?Tobacco Use  ? Smoking status: Former  ?  Types: Cigars  ? Smokeless tobacco: Not on file  ? Tobacco comments:   ?  Very occasional cigar use  ?Substance Use Topics  ? Alcohol use: Yes  ?  Comment: occassional  ? ?Allergies  ?Allergen Reactions  ? Iodinated Contrast Media Anaphylaxis  ? Shellfish-Derived Products Anaphylaxis  ? ?Prior to Admission medications   ?Medication Sig Start Date End Date Taking? Authorizing Provider  ?albuterol (VENTOLIN HFA) 108 (90 Base) MCG/ACT inhaler Inhale 2 puffs into the lungs every 6 (six) hours as needed for wheezing or shortness of breath. 09/19/21  Yes Margarita Mail, DO  ?amLODipine (NORVASC) 5 MG tablet Take 1 tablet (5 mg total) by mouth daily. 09/01/21  Yes Rolly Salter, MD  ?aspirin EC 81 MG EC tablet Take 1 tablet (81 mg total) by mouth daily. Swallow whole. 09/01/21  Yes Rolly Salter, MD  ?hydrALAZINE (APRESOLINE) 100 MG tablet Take 1 tablet (100 mg total) by mouth 3 (three) times daily. 09/19/21  Yes Margarita Mail, DO  ?lisinopril (ZESTRIL) 10 MG tablet Take 1 tablet (10 mg total) by mouth daily. 09/19/21  Yes Margarita Mail, DO  ?pantoprazole (PROTONIX) 40 MG tablet Take 40 mg by mouth daily.   Yes [provider]  ?tiZANidine (ZANAFLEX) 4 MG tablet Take 1 tablet (4 mg total) by mouth every 6 (six) hours as needed for muscle spasms. 09/19/21  Yes Margarita Mail, DO  ?EPINEPHrine 0.3 mg/0.3 mL IJ SOAJ injection Inject 0.3 mLs (0.3  mg total) into the muscle once. ?Patient not taking: Reported on 10/11/2021 12/08/15   Jeanmarie Plant, MD  ? ?Review of Systems  ?Constitutional:  Positive for fatigue. Negative for appetite change.  ?HENT:  Negative for congestion, postnasal drip and sore throat.   ?Eyes: Negative.   ?Respiratory:  Positive for apnea. Negative for cough, chest tightness and shortness of breath.   ?     +snoring  ?Cardiovascular:  Positive for chest pain (rarely). Negative for palpitations and leg swelling.  ?Gastrointestinal:  Negative for abdominal distention and abdominal pain.  ?Endocrine: Negative.   ?Genitourinary: Negative.    ?Musculoskeletal:  Positive for neck pain (right side of neck). Negative for back pain.  ?Skin: Negative.   ?Allergic/Immunologic: Negative.   ?Neurological:  Negative for dizziness and light-headedness.  ?Hematological:  Negative for adenopathy. Does not bruise/bleed easily.  ?Psychiatric/Behavioral:  Positive for sleep disturbance (sleeping on 3 pillows, periods of apnea and loud snoring). Negative for dysphoric mood. The patient is not nervous/anxious.   ? ?Vitals:  ? 12/11/21 1332  ?BP: (!) 147/64  ?Pulse: 67  ?Resp: 20  ?SpO2: 99%  ?Weight: (!) 356 lb 6 oz (161.7 kg)  ?Height: 6\' 4"  (1.93 m)  ? ?Wt Readings from Last 3 Encounters:  ?12/11/21 (!) 356 lb 6 oz (161.7 kg)  ?11/22/21 (!) 344 lb 6 oz (156.2 kg)  ?11/19/21 (!) 350 lb 12.8 oz (159.1 kg)  ? ?Lab Results  ?Component Value Date  ? CREATININE 1.31 (H) 08/31/2021  ? CREATININE 1.05 08/30/2021  ? CREATININE 1.06 08/29/2021  ? ?Physical Exam ?Vitals and nursing note reviewed.  ?Constitutional:   ?   General: Kevin Morris is not in acute distress. ?   Appearance: Normal appearance.  ?HENT:  ?   Head: Normocephalic and atraumatic.  ?Neck:  ?   Vascular: No carotid bruit.  ?Cardiovascular:  ?   Rate and Rhythm: Normal rate and regular rhythm.  ?   Heart sounds: No murmur heard. ?Pulmonary:  ?   Effort: Pulmonary effort is normal. No respiratory distress.  ?   Breath sounds: No wheezing or rales.  ?Abdominal:  ?   General: There is no distension.  ?   Palpations: Abdomen is soft.  ?Musculoskeletal:     ?   General: No tenderness.  ?   Cervical back: Normal range of motion and neck supple.  ?   Right lower leg: No edema.  ?   Left lower leg: No edema.  ?Skin: ?   General: Skin is warm and dry.  ?Neurological:  ?   General: No focal deficit present.  ?   Mental Status: Kevin Morris is alert and oriented to person, place, and time.  ?Psychiatric:     ?   Mood and Affect: Mood normal.     ?   Behavior: Behavior normal.     ?   Thought Content: Thought content normal.  ? ?Assessment &  Plan: ? ?1: Chronic heart failure with preserved ejection fraction with structural changes (LVH/LAE)- ?- NYHA class II ?- euvolemic today ?- weighing intermittently; reminded to weigh daily and to call for an overnight weight gain of > 2 pounds or a weekly weight gain of > 5 pounds ?- weight up 9 lbs since last visit on 10/11/21, appears to be r/t increased caloric intake ?- not adding salt to his food and has been reading food labels for sodium content ?- on tribenzor per HTN clinic 12/09/21) started on 11/06/21 ?- since recent addition  of tribenzor, will not transition to entresto ?- start farxiga 10 mg QD ?- provided 30 day coupon for farxiga, will provide commercial co-pay card at next visit ?- return in 1 month for BMP ?- BNP 08/29/21 was 251.6 ? ?2: HTN- ?- BP mildly elevated 147/64, re-checked 140/64 ?- saw PCP Caralee Ates) 09/19/21 ?- went to HTN clinic 11/06/21, started on tribenzor ?- BMP 08/31/21 reviewed and showed sodium 135, potassium 3.5, creatinine 1.31 and GFR >60 ? ?3: Snoring- ?- home sleep study scheduled for 12/28/21 ? ? ?Medication bottles reviewed.  ? ?Return in 1 month or sooner for any questions/problems before then.  ? ?

## 2021-12-11 NOTE — Patient Instructions (Signed)
If you have voicemail, please make sure your mailbox is cleaned out so that we may leave a message and please make sure to listen to any voicemails.   ? ?Start taking farxiga once/day. ? ?Return in 1 month.  ?

## 2021-12-26 ENCOUNTER — Telehealth: Payer: Self-pay | Admitting: *Deleted

## 2021-12-26 NOTE — Telephone Encounter (Signed)
Received a message from [place] at [facility] sleep lab the patient cancelled his HST stating that he checked with his insurance company and they will not pay for it. When PA request was submitted to his insurance no PA was required and patient was notified. At that time he asked me if the insurance would pay told him idk. He would need to check his benefits. They only tell me if it needs approval for medical necessity. Ordering MD, Dr Duke Salvia will be notified. ?

## 2021-12-28 ENCOUNTER — Encounter (HOSPITAL_BASED_OUTPATIENT_CLINIC_OR_DEPARTMENT_OTHER): Payer: Managed Care, Other (non HMO) | Admitting: Cardiovascular Disease

## 2022-01-02 ENCOUNTER — Encounter: Payer: Self-pay | Admitting: Cardiovascular Disease

## 2022-01-08 ENCOUNTER — Other Ambulatory Visit
Admission: RE | Admit: 2022-01-08 | Discharge: 2022-01-08 | Disposition: A | Discharge status: Home / Self Care | Payer: Managed Care, Other (non HMO) | Source: Ambulatory Visit | Attending: Family | Admitting: Family

## 2022-01-08 ENCOUNTER — Encounter: Payer: Self-pay | Admitting: Family

## 2022-01-08 ENCOUNTER — Ambulatory Visit: Payer: Managed Care, Other (non HMO)

## 2022-01-08 ENCOUNTER — Ambulatory Visit (HOSPITAL_BASED_OUTPATIENT_CLINIC_OR_DEPARTMENT_OTHER): Payer: Managed Care, Other (non HMO) | Admitting: Family

## 2022-01-08 VITALS — BP 126/57 | HR 65 | Resp 20 | Ht 76.0 in | Wt 353.5 lb

## 2022-01-08 DIAGNOSIS — R0683 Snoring: Secondary | ICD-10-CM | POA: Insufficient documentation

## 2022-01-08 DIAGNOSIS — Z7984 Long term (current) use of oral hypoglycemic drugs: Secondary | ICD-10-CM | POA: Insufficient documentation

## 2022-01-08 DIAGNOSIS — I5032 Chronic diastolic (congestive) heart failure: Secondary | ICD-10-CM | POA: Insufficient documentation

## 2022-01-08 DIAGNOSIS — I10 Essential (primary) hypertension: Secondary | ICD-10-CM

## 2022-01-08 DIAGNOSIS — I110 Hypertensive heart disease with heart failure: Secondary | ICD-10-CM | POA: Insufficient documentation

## 2022-01-08 LAB — BASIC METABOLIC PANEL
Anion gap: 11 (ref 5–15)
BUN: 21 mg/dL — ABNORMAL HIGH (ref 6–20)
CO2: 25 mmol/L (ref 22–32)
Calcium: 9.5 mg/dL (ref 8.9–10.3)
Chloride: 99 mmol/L (ref 98–111)
Creatinine, Ser: 1.4 mg/dL — ABNORMAL HIGH (ref 0.61–1.24)
GFR, Estimated: >60 mL/min (ref >60)
Glucose, Bld: 117 mg/dL — ABNORMAL HIGH (ref 70–99)
Potassium: 4.2 mmol/L (ref 3.5–5.1)
Sodium: 135 mmol/L (ref 135–145)

## 2022-01-08 NOTE — Patient Instructions (Signed)
Continue weighing daily and call for an overnight weight gain of 3 pounds or more or a weekly weight gain of more than 5 pounds.   If you have voicemail, please make sure your mailbox is cleaned out so that we may leave a message and please make sure to listen to any voicemails.     

## 2022-01-08 NOTE — Progress Notes (Signed)
? Patient ID: Kevin Morris, male    DOB: 14-Oct-1980, 41 y.o.   MRN: [id number] ? ? ?Kevin Morris is a 41 y/o male with a history of HTN and chronic heart failure.  ? ?Echo report from 08/30/21 reviewed and showed an EF of 55-60% along with moderate LVH/ LAE and mild Kevin.  ? ?Admitted 08/29/21 due to acute on chronic HF. Initially given IV lasix with transition to oral diuretics. Head CT performed due to HTN/ HA and it was without acute findings. HTN meds adjusted. Elevated troponin thought to be due to HTN. Discharged after 2 days.  ? ?He presents today for a follow-up visit with a chief complaint of difficulty sleeping. He says that he's been told that he snores and has had apneic episodes. Sleep study has not been approved by his insurance. Has occasional neck pain along with this. Denies any dizziness, abdominal distention, palpitations, pedal edema, chest pain, shortness of breath, cough, fatigue or weight gain.  ? ?Tolerating farxiga without known side effects.  ? ?Past Medical History:  ?Diagnosis Date  ? Apnea 11/06/2021  ? CHF (congestive heart failure) (Kevin Morris)   ? Generalized headaches   ? GERD (gastroesophageal reflux disease)   ? Hypertension   ? ?Past Surgical History:  ?Procedure Laterality Date  ? ACHILLES TENDON SURGERY Right   ? KNEE ARTHROSCOPY Bilateral   ? ORIF FINGER / THUMB FRACTURE Bilateral   ? SHOULDER ARTHROSCOPY WITH LABRAL REPAIR Right   ? ?Family History  ?Problem Relation Age of Onset  ? Hypertension Mother   ? Cancer Maternal Grandmother   ?     breast  ? Diabetes Maternal Grandmother   ? Stroke Maternal Grandmother   ? ?Social History  ? ?Tobacco Use  ? Smoking status: Former  ?  Types: Cigars  ? Smokeless tobacco: Not on file  ? Tobacco comments:  ?  Very occasional cigar use  ?Substance Use Topics  ? Alcohol use: Yes  ?  Comment: occassional  ? ?Allergies  ?Allergen Reactions  ? Iodinated Contrast Media Anaphylaxis  ? Shellfish-Derived Products Anaphylaxis  ? ?Prior to Admission  medications   ?Medication Sig Start Date End Date Taking? Authorizing Provider  ?albuterol (VENTOLIN HFA) 108 (90 Base) MCG/ACT inhaler INHALE 2 PUFFS INTO THE LUNGS EVERY 6 HOURS AS NEEDED FOR WHEEZING OR SHORTNESS OF BREATH 10/12/21  Yes Kevin Medici, DO  ?aspirin EC 81 MG EC tablet Take 1 tablet (81 mg total) by mouth daily. Swallow whole. 09/01/21  Yes Kevin Hamman, Kevin Morris  ?dapagliflozin propanediol (FARXIGA) 10 MG TABS tablet Take 1 tablet (10 mg total) by mouth daily before breakfast. 12/11/21  Yes Kevin Graff, Kevin Morris  ?Olmesartan-amLODIPine-HCTZ 40-10-25 MG TABS Take 1 tablet by mouth daily. 11/06/21  Yes Kevin Latch, Kevin Morris  ?pantoprazole (PROTONIX) 40 MG tablet Take 1 tablet (40 mg total) by mouth daily. 10/19/21  Yes Kevin Medici, DO  ?tiZANidine (ZANAFLEX) 4 MG tablet Take 1 tablet (4 mg total) by mouth every 6 (six) hours as needed for muscle spasms. 11/19/21  Yes Kevin Medici, DO  ? ? ?Review of Systems  ?Constitutional:  Negative for appetite change and fatigue.  ?HENT:  Negative for congestion, postnasal drip and sore throat.   ?Eyes: Negative.   ?Respiratory:  Positive for apnea. Negative for cough, chest tightness and shortness of breath.   ?     +snoring  ?Cardiovascular:  Negative for chest pain, palpitations and leg swelling.  ?Gastrointestinal:  Negative for abdominal distention and abdominal  pain.  ?Endocrine: Negative.   ?Genitourinary: Negative.   ?Musculoskeletal:  Positive for neck pain (right side of neck). Negative for back pain.  ?Skin: Negative.   ?Allergic/Immunologic: Negative.   ?Neurological:  Negative for dizziness and light-headedness.  ?Hematological:  Negative for adenopathy. Does not bruise/bleed easily.  ?Psychiatric/Behavioral:  Positive for sleep disturbance (sleeping on 3 pillows, periods of apnea and loud snoring). Negative for dysphoric mood. The patient is not nervous/anxious.   ? ?Vitals:  ? 01/08/22 0807  ?BP: (!) 126/57  ?Pulse: 65  ?Resp: 20  ?SpO2: 97%   ?Weight: (!) 353 lb 8 oz (160.3 kg)  ?Height: 6\' 4"  (1.93 m)  ? ?Wt Readings from Last 3 Encounters:  ?01/08/22 (!) 353 lb 8 oz (160.3 kg)  ?12/11/21 (!) 356 lb 6 oz (161.7 kg)  ?11/22/21 (!) 344 lb 6 oz (156.2 kg)  ? ?Lab Results  ?Component Value Date  ? CREATININE 1.31 (H) 08/31/2021  ? CREATININE 1.05 08/30/2021  ? CREATININE 1.06 08/29/2021  ? ?Physical Exam ?Vitals and nursing note reviewed.  ?Constitutional:   ?   General: He is not in acute distress. ?   Appearance: Normal appearance.  ?HENT:  ?   Head: Normocephalic and atraumatic.  ?Neck:  ?   Vascular: No carotid bruit.  ?Cardiovascular:  ?   Rate and Rhythm: Normal rate and regular rhythm.  ?   Heart sounds: No murmur heard. ?Pulmonary:  ?   Effort: Pulmonary effort is normal. No respiratory distress.  ?   Breath sounds: No wheezing or rales.  ?Abdominal:  ?   General: There is no distension.  ?   Palpations: Abdomen is soft.  ?Musculoskeletal:     ?   General: No tenderness.  ?   Cervical back: Normal range of motion and neck supple.  ?   Right lower leg: No edema.  ?   Left lower leg: No edema.  ?Skin: ?   General: Skin is warm and dry.  ?Neurological:  ?   General: No focal deficit present.  ?   Mental Status: He is alert and oriented to person, place, and time.  ?Psychiatric:     ?   Mood and Affect: Mood normal.     ?   Behavior: Behavior normal.     ?   Thought Content: Thought content normal.  ? ?Assessment & Plan: ? ?1: Chronic heart failure with preserved ejection fraction with structural changes (LVH/LAE)- ?- NYHA class I ?- euvolemic today ?- weighing intermittently; reminded to weigh daily and to call for an overnight weight gain of > 2 pounds or a weekly weight gain of > 5 pounds ?- weight down 3 lbs since last visit 1 month ago ?- not adding salt to his food and has been reading food labels for sodium content ?- on tribenzor per HTN clinic Kevin Morris) started on 11/06/21 ?- farxiga 10 mg QD started at last visi ?- check BMP today ?- BNP  08/29/21 was 251.6 ? ?2: HTN- ?- BP looks good (126/57) ?- saw Kevin Morris) 09/19/21 ?- went to HTN clinic 11/06/21, started on tribenzor ?- BMP 08/31/21 reviewed and showed sodium 135, potassium 3.5, creatinine 1.31 and GFR >60 ? ?3: Snoring- ?- home sleep study scheduled for 12/28/21 was cancelled due to insurance not covering it ?- he may get home sleep study done if insurance will cover that ? ? ?Patient did not bring his medications nor a list. Each medication was verbally reviewed with the patient and  he was encouraged to bring the bottles to every visit to confirm accuracy of list.  ? ?Return in 6 months, sooner if needed.  ? ? ?

## 2022-01-08 NOTE — Progress Notes (Deleted)
Patient ID: Quanta Roher                 DOB: June 25, 1981                      MRN: [id number] ? ? ? ? ?HPI: ?Munachimso Palin is a 41 y.o. male referred by Dr. Duke Salvia to Adv HTN clinic. PMH is significant for CHF, HTN, obesity, and OSA.  Home sleep study has been scheduled. Patient randomized to group II. Previously admitted on 10/29/20 for HF exacerbation.  At last visit, Dr Duke Salvia d/c hydralazine and combined medications into Tribenzor. ? ?Current HTN meds:  ?Olmesartan/amlodipine/HCTZ 40/10/25 daily ?Farxiga 10mg   ?Previously tried:  ?Lisinopril 10mg  ?HCTZ 12.5mg  ?Hydralazine 100 TID ? ?BP goal: <130/80 ? ?Family History:  ? ?Social History:  ? ?Diet:  ? ?Exercise:  ? ?Home BP readings:  ? ?Wt Readings from Last 3 Encounters:  ?12/11/21 (!) 356 lb 6 oz (161.7 kg)  ?11/22/21 (!) 344 lb 6 oz (156.2 kg)  ?11/19/21 (!) 350 lb 12.8 oz (159.1 kg)  ? ?BP Readings from Last 3 Encounters:  ?12/11/21 140/64  ?11/19/21 (!) 148/98  ?11/06/21 (!) 150/76  ? ?Pulse Readings from Last 3 Encounters:  ?12/11/21 67  ?11/19/21 82  ?11/06/21 71  ? ? ?Renal function: ?CrCl cannot be calculated (Patient's most recent lab result is older than the maximum 21 days allowed.). ? ?Past Medical History:  ?Diagnosis Date  ? Apnea 11/06/2021  ? CHF (congestive heart failure) (HCC)   ? Generalized headaches   ? GERD (gastroesophageal reflux disease)   ? Hypertension   ? ? ?Current Outpatient Medications on File Prior to Visit  ?Medication Sig Dispense Refill  ? albuterol (VENTOLIN HFA) 108 (90 Base) MCG/ACT inhaler INHALE 2 PUFFS INTO THE LUNGS EVERY 6 HOURS AS NEEDED FOR WHEEZING OR SHORTNESS OF BREATH 24 g 0  ? aspirin EC 81 MG EC tablet Take 1 tablet (81 mg total) by mouth daily. Swallow whole. 30 tablet 11  ? dapagliflozin propanediol (FARXIGA) 10 MG TABS tablet Take 1 tablet (10 mg total) by mouth daily before breakfast. 30 tablet 5  ? Olmesartan-amLODIPine-HCTZ 40-10-25 MG TABS Take 1 tablet by mouth daily. 90 tablet 3  ? pantoprazole  (PROTONIX) 40 MG tablet Take 1 tablet (40 mg total) by mouth daily. 90 tablet 3  ? tiZANidine (ZANAFLEX) 4 MG tablet Take 1 tablet (4 mg total) by mouth every 6 (six) hours as needed for muscle spasms. 30 tablet 0  ? ?No current facility-administered medications on file prior to visit.  ? ? ?Allergies  ?Allergen Reactions  ? Iodinated Contrast Media Anaphylaxis  ? Shellfish-Derived Products Anaphylaxis  ? ? ? ?Assessment/Plan: ? ?1. Hypertension -   ?

## 2022-01-10 DIAGNOSIS — I10 Essential (primary) hypertension: Secondary | ICD-10-CM | POA: Diagnosis not present

## 2022-01-22 ENCOUNTER — Other Ambulatory Visit: Payer: Self-pay

## 2022-01-22 ENCOUNTER — Encounter: Payer: Self-pay | Admitting: Family Medicine

## 2022-01-22 ENCOUNTER — Ambulatory Visit: Payer: Self-pay | Admitting: *Deleted

## 2022-01-22 ENCOUNTER — Ambulatory Visit: Payer: Managed Care, Other (non HMO) | Admitting: Family Medicine

## 2022-01-22 VITALS — BP 130/82 | HR 82 | Temp 98.1°F | Resp 18 | Ht 76.0 in | Wt 350.0 lb

## 2022-01-22 DIAGNOSIS — K0889 Other specified disorders of teeth and supporting structures: Secondary | ICD-10-CM | POA: Diagnosis not present

## 2022-01-22 DIAGNOSIS — K029 Dental caries, unspecified: Secondary | ICD-10-CM

## 2022-01-22 MED ORDER — AMOXICILLIN-POT CLAVULANATE 875-125 MG PO TABS: 1.0000 | ORAL_TABLET | Freq: Two times a day (BID) | ORAL | 0 refills | Status: AC

## 2022-01-22 MED ORDER — NAPROXEN 500 MG PO TABS: 500.0000 mg | ORAL_TABLET | Freq: Two times a day (BID) | ORAL | 0 refills | Status: DC

## 2022-01-22 NOTE — Progress Notes (Signed)
? ? ?  SUBJECTIVE:  ? ?CHIEF COMPLAINT / HPI:  ? ?Tooth ache ?Endorsing pain and swelling in L lower 1st molar for about 1 week. Denies fevers. No trauma or prior surgeries. Has not seen dentist in some time. Using ibuprofen 800mg  and oragel without relief.  ? ?OBJECTIVE:  ? ?BP 130/82   Pulse 82   Temp 98.1 ?F (36.7 ?C) (Oral)   Resp 18   Wt (!) 350 lb (158.8 kg)   SpO2 96%   BMI 42.60 kg/m?   ?Gen: well appearing, in NAD ?HEENT: Fair dentition. 1st and 2nd molars with visible cavities. TTP along root/gum of 1st molar, slight swelling. No fluctuance. No submandibular or preauricular lymphadenopathy. ? ? ?ASSESSMENT/PLAN:  ? ?Tooth ache ?With visible decay and concern for root infection, rx augmentin. Will refer to dentistry for management. Naproxen provided for pain relief. ? ? ?Myles Gip, DO ?

## 2022-01-22 NOTE — Telephone Encounter (Signed)
?  Chief Complaint: toothache, requesting pain medication  ?Symptoms: toothache left back lower tooth . Constant throbbing pain sensitive to hot and cold and if anything touches it. Able to chew on opposite side .  ?Frequency: 1 week ago  ?Pertinent Negatives: Patient denies fever, facial swelling  ?Disposition: [] ED /[] Urgent Care (no appt availability in office) / [x] Appointment(In office/virtual)/ []  [facility]/ [] Home Care/ [x] Refused Recommended Disposition /[] [facility]/ []  Follow-up with PCP ?Additional Notes:  ? ?Patient requesting if he needs appt . Attempted to schedule today . Patient would like a call back if appt necessary. Reviewed if medication requesting then yes for appt. Requesting PCP to call back .  ? ? ? Reason for Disposition ? [1] SEVERE pain (e.g., excruciating, unable to do any normal activities) AND [2] not improved 2 hours after pain medicine ? ?Answer Assessment - Initial Assessment Questions ?1. LOCATION: "Which tooth is hurting?"  (e.g., right-side/left-side, upper/lower, front/back) ?    Left back lower tooth  ?2. ONSET: "When did the toothache start?"  (e.g., hours, days)  ?    1 week ago  ?3. SEVERITY: "How bad is the toothache?"  (Scale 1-10; mild, moderate or severe) ?  - MILD (1-3): doesn't interfere with chewing  ?  - MODERATE (4-7): interferes with chewing, interferes with normal activities, awakens from sleep   ?  - SEVERE (8-10): unable to eat, unable to do any normal activities, excruciating pain    ?    Severe if anything touches tooth ?4. SWELLING: "Is there any visible swelling of your face?" ?    no ?5. OTHER SYMPTOMS: "Do you have any other symptoms?" (e.g., fever) ?    Constant throbbing  ?6. PREGNANCY: "Is there any chance you are pregnant?" "When was your last menstrual period?" ?    na ? ?Protocols used: Toothache-A-AH ? ?

## 2022-01-31 ENCOUNTER — Encounter: Payer: Self-pay | Admitting: Pharmacist Clinician (PhC)/ Clinical Pharmacy Specialist

## 2022-01-31 ENCOUNTER — Ambulatory Visit: Payer: Managed Care, Other (non HMO) | Admitting: Pharmacist Clinician (PhC)/ Clinical Pharmacy Specialist

## 2022-01-31 DIAGNOSIS — I10 Essential (primary) hypertension: Secondary | ICD-10-CM | POA: Diagnosis not present

## 2022-01-31 MED ORDER — CARVEDILOL 6.25 MG PO TABS: 6.2500 mg | ORAL_TABLET | Freq: Two times a day (BID) | ORAL | 3 refills | Status: DC

## 2022-01-31 NOTE — Progress Notes (Signed)
? ? ? ?01/31/2022 ?Kevin Morris ?1981-02-11 ?[id number] ? ? ?HPI:  Kevin Morris is a 41 y.o. male patient of Dr Duke Salvia, with a PMH below who presents today for advanced hypertension clinic follow up.  Patient reported that he was not aware of being hypertensive prior to hospitalization in November.  At that visit with Dr. Duke Salvia, pressure was 150/76.  T that time he was on amlodipine, hctz, hydralazine and lisinopril.  He was switched to Tribenzor for ease of use and was going to continue working on dietary and exercise plans.  Patient was willing to join research project and randomized to group 2, with RPM.   ? ?Today he returns for his follow up visit.  He has been checking BP at home regularly and had no issues with switching all of his meds to the Tribenzor.  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  He notes that at a visit in March he was told to restart the lisinopril 10 mg, as his pressure was still elevated.  I cannot find the information in the AVS from that visit (March 7, but he does have a new bottle of lisinopril)  He also started [place] at that time.   ? ?Past Medical History: ?CHF Chronic diastolic (11/22 had acute on chronic episode, EF at 55-60%)  ?OSA Sleep study to be done  ?obesity Working on lifestyle modification  ?GERD On pantoprazole  ?  ? ?Blood Pressure Goal:  130/80 ? ?Current Medications: olmesartan/amlodipine/hctz 40/10/25 ? ?Family Hx: mother has hypertension, well controlled; younger brother healthy; daughter 37 healthy - basketball player ? ?Social Hx: no tobacco, social alcohol 2-3 times per month; occasional coffee ? ?Diet: home cooked mostly, does occasionally treat himself to eating out;  prefers to meal prep; fresh f/v daily; staying aaway from meat, mostly [place] and chicken some beef, no processed pr prepackaged ? ?Exercise: lifting weights, light on cardio since heart issues, will start to pick that up ? ?Home BP  readings: in Vivify - using cuff on left forearm ? 14 day average 134/75  (range 118-142/64-90) ? 30 day average 133/74  (range 118-144/64-90) ?  ?Intolerances: iodinated contrast media, shellfish ? ?Labs:  4/23:  Na 135, K 4.2 Glu 117, BUN 21, SCr 1.40, GFR > 60 ? ? ?Wt Readings from Last 3 Encounters:  ?01/31/22 (!) 345 lb (156.5 kg)  ?01/22/22 (!) 350 lb (158.8 kg)  ?01/08/22 (!) 353 lb 8 oz (160.3 kg)  ? ?BP Readings from Last 3 Encounters:  ?01/31/22 109/69  ?01/22/22 130/82  ?01/08/22 (!) 126/57  ? ?Pulse Readings from Last 3 Encounters:  ?01/31/22 71  ?01/22/22 82  ?01/08/22 65  ? ? ?Current Outpatient Medications  ?Medication Sig Dispense Refill  ? albuterol (VENTOLIN HFA) 108 (90 Base) MCG/ACT inhaler INHALE 2 PUFFS INTO THE LUNGS EVERY 6 HOURS AS NEEDED FOR WHEEZING OR SHORTNESS OF BREATH 24 g 0  ? aspirin EC 81 MG EC tablet Take 1 tablet (81 mg total) by mouth daily. Swallow whole. 30 tablet 11  ? carvedilol (COREG) 6.25 MG tablet Take 1 tablet (6.25 mg total) by mouth 2 (two) times daily. 180 tablet 3  ? dapagliflozin propanediol (FARXIGA) 10 MG TABS tablet Take 1 tablet (10 mg total) by mouth daily before breakfast. 30 tablet 5  ? naproxen (NAPROSYN) 500 MG tablet Take 1 tablet (500 mg total) by mouth 2 (two) times daily with a meal. 30 tablet 0  ? Olmesartan-amLODIPine-HCTZ  40-10-25 MG TABS Take 1 tablet by mouth daily. 90 tablet 3  ? pantoprazole (PROTONIX) 40 MG tablet Take 1 tablet (40 mg total) by mouth daily. 90 tablet 3  ? tiZANidine (ZANAFLEX) 4 MG tablet Take 1 tablet (4 mg total) by mouth every 6 (six) hours as needed for muscle spasms. 30 tablet 0  ? ?No current facility-administered medications for this visit.  ? ? ?Allergies  ?Allergen Reactions  ? Iodinated Contrast Media Anaphylaxis  ? Shellfish-Derived Products Anaphylaxis  ? ? ?Past Medical History:  ?Diagnosis Date  ? Apnea 11/06/2021  ? CHF (congestive heart failure) (HCC)   ? Generalized headaches   ? GERD (gastroesophageal reflux  disease)   ? Hypertension   ? ? ?Blood pressure 109/69, pulse 71, resp. rate 15, height 6\' 4"  (1.93 m), weight (!) 345 lb (156.5 kg), SpO2 97 %. ? ?Resistant hypertension ?Patient with resistant hypertension, currently on maximum doses of olmesartan, hctz and amlodipine.  Will have him discontinue the lisinopril, as it will show little benefit.  Instead will have him start carvedilol 6.25 mg twice daily.  He will continue to check home blood pressure readings through the Vivify app and we will monitor appropriately.  He will follow up with Dr. in 2 months and was reminded to return the BP cuff at that time.   ? ? ?Duke Salvia PharmD CPP [facility] ?[facility] ?[address] ?[place], [place] ?[phone] ?

## 2022-01-31 NOTE — Patient Instructions (Signed)
Return for a a follow up appointment June 13 with Dr. Oval Linsey.  Please bring your BP device back to this appointment.   ? ?Check your blood pressure at home daily and keep record of the readings. ? ?Take your BP meds as follows: ? Start carvedilol 6.25 mg twice daily ? Stop lisinopril ? Continue with olmesartan/amlodipine/hctz ? ?Bring all of your meds, your BP cuff and your record of home blood pressures to your next appointment.  Exercise as you?re able, try to walk approximately 30 minutes per day.  Keep salt intake to a minimum, especially watch canned and prepared boxed foods.  Eat more fresh fruits and vegetables and fewer canned items.  Avoid eating in fast food restaurants.  ? ? HOW TO TAKE YOUR BLOOD PRESSURE: ?Rest 5 minutes before taking your blood pressure. ? Don?t smoke or drink caffeinated beverages for at least 30 minutes before. ?Take your blood pressure before (not after) you eat. ?Sit comfortably with your back supported and both feet on the floor (don?t cross your legs). ?Elevate your arm to heart level on a table or a desk. ?Use the proper sized cuff. It should fit smoothly and snugly around your bare upper arm. There should be enough room to slip a fingertip under the cuff. The bottom edge of the cuff should be 1 inch above the crease of the elbow. ?Ideally, take 3 measurements at one sitting and record the average. ? ? ?

## 2022-01-31 NOTE — Assessment & Plan Note (Signed)
Patient with resistant hypertension, currently on maximum doses of olmesartan, hctz and amlodipine.  Will have him discontinue the lisinopril, as it will show little benefit.  Instead will have him start carvedilol 6.25 mg twice daily.  He will continue to check home blood pressure readings through the Vivify app and we will monitor appropriately.  He will follow up with Dr. Duke Salvia in 2 months and was reminded to return the BP cuff at that time.   ?

## 2022-02-13 ENCOUNTER — Telehealth: Payer: Self-pay

## 2022-02-13 DIAGNOSIS — Z0000 Encounter for general adult medical examination without abnormal findings: Secondary | ICD-10-CM

## 2022-02-13 NOTE — Telephone Encounter (Signed)
Called patient to determine if he was experiencing connectivity issues since we have not had a reading populate since May 5th. Patient stated that connection was not an issue and that he has been forgetting with other things going on but he will resume checking his bp today. Asked patient is prompt times worked for him or would he like to receive his prompts at another time. Patient stated that he receives the prompts on his phone, it's just that he forgets to stop and check it when doing other things. Reminded patient that we monitor his readings daily and that he would be call if more readings are not populating. Patient expressed verbally understanding.  ? ? ?Kevin Morris, [place] ?[facility] ?Care Guide, Health Coach ?[address] ?[place] [address] ?Telephone: [phone] ?Email: [email or web address] ? ?

## 2022-02-15 ENCOUNTER — Ambulatory Visit: Payer: Managed Care, Other (non HMO) | Admitting: Internal Medicine

## 2022-02-15 ENCOUNTER — Encounter: Payer: Self-pay | Admitting: Internal Medicine

## 2022-02-15 VITALS — BP 136/82 | HR 79 | Temp 98.2°F | Resp 16 | Ht 76.0 in | Wt 350.3 lb

## 2022-02-15 DIAGNOSIS — I5032 Chronic diastolic (congestive) heart failure: Secondary | ICD-10-CM | POA: Diagnosis not present

## 2022-02-15 DIAGNOSIS — K029 Dental caries, unspecified: Secondary | ICD-10-CM | POA: Diagnosis not present

## 2022-02-15 DIAGNOSIS — I10 Essential (primary) hypertension: Secondary | ICD-10-CM

## 2022-02-15 DIAGNOSIS — K219 Gastro-esophageal reflux disease without esophagitis: Secondary | ICD-10-CM | POA: Diagnosis not present

## 2022-02-15 DIAGNOSIS — M62838 Other muscle spasm: Secondary | ICD-10-CM

## 2022-02-15 MED ORDER — TIZANIDINE HCL 4 MG PO TABS: 4.0000 mg | ORAL_TABLET | Freq: Four times a day (QID) | ORAL | 0 refills | Status: DC | PRN

## 2022-02-15 MED ORDER — AMOXICILLIN-POT CLAVULANATE 875-125 MG PO TABS: 1.0000 | ORAL_TABLET | Freq: Two times a day (BID) | ORAL | 0 refills | Status: AC

## 2022-02-15 NOTE — Patient Instructions (Addendum)
It was great seeing you today! ? ?Plan discussed at today's visit: ?-Continue all blood pressure medications and continue to monitor at home ?-Stop daily Pantoprazole, can use as needed for acid reflux  ?-Muscle relaxers refilled ?-New course of antibiotics sent, can cause diarrhea so recommend taking probiotics and would prioritize seeing dentist soon to have the tooth pulled  ? ?Follow up in: 6 months  ? ?Take care and let us know if you have any questions or concerns prior to your next visit. ? ?Dr. Caralee Ates ? ?

## 2022-02-15 NOTE — Progress Notes (Signed)
? ?Established Patient Office Visit ? ?Subjective:  ?Patient ID: Kevin Morris, male    DOB: [birth year]  Age: 40 y.o. MRN: [id number] ? ?CC:  ?Chief Complaint  ?Patient presents with  ? Follow-up  ? Hypertension  ? Gastroesophageal Reflux  ? ? ?HPI ?Kevin Morris presents for BP follow up.  ? ?Hypertension: ?-Now following with HTN clinic ?-Medications: Amlodipine-HCTZ-Olmesartan 08-01-39, Coreg 6.25 BID ?-Patient is compliant with above medications and reports no side effects. ?-Checking BP at home (average): is checking at home - 130-140/80-90 ?-Denies any chest pain, SOB, vision changes, LE edema or symptoms of hypotension, headaches improving ?-Diet: decreasing sodium in the diet, working on lifestyle changes ?-Exercise: trying to start working out again with weights, had been a Clinical cytogeneticist in the past ? ?HFpEF: ?-Last echo 08/30/21 - EF 55-60% without wall motion abnormalities or valvular pathology and LVH ?-Following up with Cardiology, recently added Farxiga 10 mg and Coreg. Note reviewed from 01/31/22. ?-Patient is weighing daily, monitoring sodium and fluid intake  ?-Had been complaining of chest pain at last visit -  ?  ?GERD: ?-Currently on Pantoprazole 40 mg which has resolved his symptoms  ?  ?Headaches: ?-Decreasing in frequency with better controlled BP ?-Sleep study ordered but not covered by insurance  ? ?Dental Infection: ?-At his LOV in March, he was seen for dental pain and found to have an infection. He was prescribed Augmentin which he finished and did well with. Today he states he is still having the pain and the infection has returned, as he did not have time to go to a dentist and have the tooth pulled. He denies any other tooth pain, swelling or fevers.  ? ?Past Medical History:  ?Diagnosis Date  ? Apnea 11/06/2021  ? CHF (congestive heart failure) (HCC)   ? Generalized headaches   ? GERD (gastroesophageal reflux disease)   ? Hypertension   ? ? ?Past Surgical History:   ?Procedure Laterality Date  ? ACHILLES TENDON SURGERY Right   ? KNEE ARTHROSCOPY Bilateral   ? ORIF FINGER / THUMB FRACTURE Bilateral   ? SHOULDER ARTHROSCOPY WITH LABRAL REPAIR Right   ? ? ?Family History  ?Problem Relation Age of Onset  ? Hypertension Mother   ? Cancer Maternal Grandmother   ?     breast  ? Diabetes Maternal Grandmother   ? Stroke Maternal Grandmother   ? ? ?Social History  ? ?Socioeconomic History  ? Marital status: Single  ?  Spouse name: Not on file  ? Number of children: Not on file  ? Years of education: Not on file  ? Highest education level: Not on file  ?Occupational History  ? Not on file  ?Tobacco Use  ? Smoking status: Former  ?  Types: Cigars  ? Smokeless tobacco: Not on file  ? Tobacco comments:  ?  Very occasional cigar use  ?Vaping Use  ? Vaping Use: Never used  ?Substance and Sexual Activity  ? Alcohol use: Yes  ?  Comment: occassional  ? Drug use: Never  ? Sexual activity: Yes  ?Other Topics Concern  ? Not on file  ?Social History Narrative  ? Not on file  ? ?Social Determinants of Health  ? ?Financial Resource Strain: Not on file  ?Food Insecurity: No Food Insecurity  ? Worried About Programme researcher, broadcasting/film/video in the Last Year: Never true  ? Ran Out of Food in the Last Year: Never true  ?Transportation Needs: No Transportation Needs  ? Lack  of Transportation (Medical): No  ? Lack of Transportation (Non-Medical): No  ?Physical Activity: Sufficiently Active  ? Days of Exercise per Week: 4 days  ? Minutes of Exercise per Session: 130 min  ?Stress: Not on file  ?Social Connections: Not on file  ?Intimate Partner Violence: Not on file  ? ? ?Outpatient Medications Prior to Visit  ?Medication Sig Dispense Refill  ? albuterol (VENTOLIN HFA) 108 (90 Base) MCG/ACT inhaler INHALE 2 PUFFS INTO THE LUNGS EVERY 6 HOURS AS NEEDED FOR WHEEZING OR SHORTNESS OF BREATH 24 g 0  ? aspirin EC 81 MG EC tablet Take 1 tablet (81 mg total) by mouth daily. Swallow whole. 30 tablet 11  ? carvedilol (COREG) 6.25  MG tablet Take 1 tablet (6.25 mg total) by mouth 2 (two) times daily. 180 tablet 3  ? dapagliflozin propanediol (FARXIGA) 10 MG TABS tablet Take 1 tablet (10 mg total) by mouth daily before breakfast. 30 tablet 5  ? naproxen (NAPROSYN) 500 MG tablet Take 1 tablet (500 mg total) by mouth 2 (two) times daily with a meal. 30 tablet 0  ? Olmesartan-amLODIPine-HCTZ 40-10-25 MG TABS Take 1 tablet by mouth daily. 90 tablet 3  ? pantoprazole (PROTONIX) 40 MG tablet Take 1 tablet (40 mg total) by mouth daily. 90 tablet 3  ? tiZANidine (ZANAFLEX) 4 MG tablet Take 1 tablet (4 mg total) by mouth every 6 (six) hours as needed for muscle spasms. 30 tablet 0  ? ?No facility-administered medications prior to visit.  ? ? ?Allergies  ?Allergen Reactions  ? Iodinated Contrast Media Anaphylaxis  ? Shellfish-Derived Products Anaphylaxis  ? ? ?ROS ?Review of Systems  ?Constitutional:  Negative for chills, diaphoresis and fever.  ?HENT:  Positive for dental problem.   ?Eyes:  Negative for visual disturbance.  ?Respiratory:  Negative for shortness of breath.   ?Cardiovascular:  Negative for chest pain, palpitations and leg swelling.  ?Gastrointestinal:  Negative for abdominal pain, nausea and vomiting.  ?Neurological:  Negative for dizziness and headaches.  ? ?  ?Objective:  ?  ?Physical Exam ?Constitutional:   ?   Appearance: Normal appearance.  ?HENT:  ?   Head: Normocephalic and atraumatic.  ?   Mouth/Throat:  ?   Comments: Left front molar cracked with surrounding gingivitis, swollen with erythema  ?Eyes:  ?   Conjunctiva/sclera: Conjunctivae normal.  ?Cardiovascular:  ?   Rate and Rhythm: Normal rate and regular rhythm.  ?Pulmonary:  ?   Effort: Pulmonary effort is normal.  ?   Breath sounds: Normal breath sounds.  ?Musculoskeletal:  ?   Right lower leg: No edema.  ?   Left lower leg: No edema.  ?Skin: ?   General: Skin is warm and dry.  ?Neurological:  ?   General: No focal deficit present.  ?   Mental Status: He is alert. Mental  status is at baseline.  ?Psychiatric:     ?   Mood and Affect: Mood normal.     ?   Behavior: Behavior normal.  ? ? ?BP 136/82   Pulse 79   Temp 98.2 ?F (36.8 ?C)   Resp 16   Ht 6\' 4"  (1.93 m)   Wt (!) 350 lb 4.8 oz (158.9 kg)   SpO2 94%   BMI 42.64 kg/m?  ?Wt Readings from Last 3 Encounters:  ?02/15/22 (!) 350 lb 4.8 oz (158.9 kg)  ?01/31/22 (!) 345 lb (156.5 kg)  ?01/22/22 (!) 350 lb (158.8 kg)  ? ?Vitals:  ? 02/15/22 0808  ?  BP: 136/82  ? ? ? ?Health Maintenance Due  ?Topic Date Due  ? Hepatitis C Screening  Never done  ? ? ?There are no preventive care reminders to display for this patient. ? ?Lab Results  ?Component Value Date  ? TSH 2.219 08/29/2021  ? ?Lab Results  ?Component Value Date  ? WBC 7.6 08/31/2021  ? HGB 16.6 08/31/2021  ? HCT 47.4 08/31/2021  ? MCV 87.0 08/31/2021  ? PLT 318 08/31/2021  ? ?Lab Results  ?Component Value Date  ? NA 135 01/08/2022  ? K 4.2 01/08/2022  ? CO2 25 01/08/2022  ? GLUCOSE 117 (H) 01/08/2022  ? BUN 21 (H) 01/08/2022  ? CREATININE 1.40 (H) 01/08/2022  ? BILITOT 1.1 08/30/2021  ? ALKPHOS 51 08/30/2021  ? AST 29 08/30/2021  ? ALT 45 (H) 08/30/2021  ? PROT 7.4 08/30/2021  ? ALBUMIN 4.2 08/30/2021  ? CALCIUM 9.5 01/08/2022  ? ANIONGAP 11 01/08/2022  ? ?No results found for: CHOL ?No results found for: HDL ?No results found for: LDLCALC ?No results found for: TRIG ?No results found for: CHOLHDL ?Lab Results  ?Component Value Date  ? HGBA1C 5.5 08/29/2021  ? ? ?  ?Assessment & Plan:  ? ?1. Resistant hypertension: Blood pressure stable today, at goal. Following with the HTN clinic and Cardiology. Currently on Olmesartan-Amlodipine-HCTZ 40-10-25 mg, Coreg 6.25 BID. He will continue these medications and continue to check his BP at home.  ? ?2. Chronic diastolic heart failure (HCC): Stable, following with Cardiology, note reviewed from 01/31/22. He is euvolemic today. He continues to monitor weights daily, sodium and fluid intake. He has gotten back on track with his diet and is  exercising more.  ? ?3. Gastroesophageal reflux disease, unspecified whether esophagitis present: Stable, stop daily Pantoprazole, can continue taking it on a as needed basis.  ? ?4. Tooth decay: Infection still p

## 2022-03-15 ENCOUNTER — Telehealth: Payer: Self-pay

## 2022-03-15 DIAGNOSIS — Z0000 Encounter for general adult medical examination without abnormal findings: Secondary | ICD-10-CM

## 2022-03-15 NOTE — Telephone Encounter (Signed)
Called patient to inform him to bring his Vivify cuff/device with him to his next appt on 6/13 to return. Explained to patient that if Dr. Duke Salvia decides to re-enroll him in the program that he will be able to take the device back with him. Patient expressed verbal understanding.    Lynnae Ludemann Nedra Hai, [facility] Guide, Health Coach [address], [address] [place] [address] Telephone: [phone] Email: [email or web address]

## 2022-03-18 ENCOUNTER — Ambulatory Visit (HOSPITAL_BASED_OUTPATIENT_CLINIC_OR_DEPARTMENT_OTHER): Payer: Managed Care, Other (non HMO) | Admitting: Cardiovascular Disease

## 2022-03-18 NOTE — Progress Notes (Signed)
[facility] Follow-up:   Date:  03/19/2022   ID:  Kevin Morris, DOB December 14, 1980, MRN [id number]  PCP:  Teodora Medici, DO  Cardiologist:  None  Nephrologist:  Referring MD: Teodora Medici, DO   CC: Hypertension  History of Present Illness:    Kevin Morris is a 41 y.o. male with a hx of chronic diastolic heart failure, hypertension, obesity, and OSA, here for follow-up. He was initially seen 11/06/21 in the [facility]. He was admitted 08/2021 with acute on chronic diastolic heart failure. Echo on admission showed LVEF 55-60% and moderate LVH. He had asymmetric hypertrophy in the inferolateral regions. Diastolic function was indeterminate. He had renal artery dopplers that were negative. He was diuresed with IV lasix and amlodipine was added to his regimen. They increased hydralazine and gave him labetalol as needed. AM cortisol was low at 5.9, renin and aldosterone levels were normal. They recommended an outpatient sleep study.  At his last appointment, he reported at home BP readings ranging in the 160s-180s/70s-80s. He also complained of slight chest pains for 1-2 weeks prior while lying down at night. Also at night he had snoring, occasional PND and night sweats. He had previously been unable to complete a sleep study as it would not be covered by his insurance. We attempted to assist him with receiving approval for a sleep study. Amlodipine, hydralazine, HCTZ, and lisinopril were stopped. He was switched to Tribenzor (amlodipine/HCTZ/olmesartan 08/01/39 mg daily). He was enrolled in [employer] remote patient monitoring. Lately his blood pressures have been running in 120s-140s/70s-80s. He followed up with our pharmacist 01/2022; she noted that he was told by another provider to add lisinopril. She had him discontinue this and started carvedilol.   Today, he states that things are going well. His breathing has been okay, although sometimes he hears  his breathing is louder than normal and he may feel short winded. He completes weight lifting exercises and 3-4 days of cardio in a week (walking, treadmill, elliptical). For the most part he feels well while exercising; however, he has noticed he isn't exercising to the same capacity as he did previously. Generally he continues to follow a healthy diet and tries to monitor his salt intake. Mostly he is feeling well rested in the morning, unless he has a restless night. He confirms snoring. A few years ago he had issues with PND and waking himself up from snoring. In the past year he reports improvement with no significant PND episodes. He reports that his insurance would not cover a home sleep study. Due to prohibitive costs, he has not been taking [place]. Otherwise he remains compliant with his medications. He confirms taking 81 mg aspirin for preventative measures. He denies any palpitations, chest pain, or peripheral edema. No lightheadedness, headaches, or syncope.   Past Medical History:  Diagnosis Date   Apnea 11/06/2021   CHF (congestive heart failure) (HCC)    Generalized headaches    GERD (gastroesophageal reflux disease)    Hypertension     Past Surgical History:  Procedure Laterality Date   ACHILLES TENDON SURGERY Right    KNEE ARTHROSCOPY Bilateral    ORIF FINGER / THUMB FRACTURE Bilateral    SHOULDER ARTHROSCOPY WITH LABRAL REPAIR Right     Current Medications: Current Meds  Medication Sig   albuterol (VENTOLIN HFA) 108 (90 Base) MCG/ACT inhaler INHALE 2 PUFFS INTO THE LUNGS EVERY 6 HOURS AS NEEDED FOR WHEEZING OR SHORTNESS OF BREATH   carvedilol (COREG) 6.25 MG tablet  Take 1 tablet (6.25 mg total) by mouth 2 (two) times daily.   naproxen (NAPROSYN) 500 MG tablet Take 1 tablet (500 mg total) by mouth 2 (two) times daily with a meal.   Olmesartan-amLODIPine-HCTZ 40-10-25 MG TABS Take 1 tablet by mouth daily.   pantoprazole (PROTONIX) 40 MG tablet Take 1 tablet (40 mg total) by  mouth daily. (Patient taking differently: Take 40 mg by mouth as needed.)   tiZANidine (ZANAFLEX) 4 MG tablet Take 1 tablet (4 mg total) by mouth every 6 (six) hours as needed for muscle spasms.   [DISCONTINUED] aspirin EC 81 MG EC tablet Take 1 tablet (81 mg total) by mouth daily. Swallow whole.     Allergies:   Iodinated contrast media and Shellfish-derived products   Social History   Socioeconomic History   Marital status: Single    Spouse name: Not on file   Number of children: Not on file   Years of education: Not on file   Highest education level: Not on file  Occupational History   Not on file  Tobacco Use   Smoking status: Former    Types: Cigars   Smokeless tobacco: Not on file   Tobacco comments:    Very occasional cigar use  Vaping Use   Vaping Use: Never used  Substance and Sexual Activity   Alcohol use: Yes    Comment: occassional   Drug use: Never   Sexual activity: Yes  Other Topics Concern   Not on file  Social History Narrative   Not on file   Social Determinants of Health   Financial Resource Strain: Not on file  Food Insecurity: No Food Insecurity (11/06/2021)   Hunger Vital Sign    Worried About Running Out of Food in the Last Year: Never true    Ran Out of Food in the Last Year: Never true  Transportation Needs: No Transportation Needs (11/06/2021)   PRAPARE - Hydrologist (Medical): No    Lack of Transportation (Non-Medical): No  Physical Activity: Sufficiently Active (11/06/2021)   Exercise Vital Sign    Days of Exercise per Week: 4 days    Minutes of Exercise per Session: 130 min  Stress: Not on file  Social Connections: Not on file     Family History: The patient's family history includes Cancer in his maternal grandmother; Diabetes in his maternal grandmother; Hypertension in his mother; Stroke in his maternal grandmother.  ROS:   Please see the history of present illness.    (+) Shortness of breath (+)  Snoring All other systems reviewed and are negative.  EKGs/Labs/Other Studies Reviewed:    Renal Artery Dopplers 08/31/2021: IMPRESSION: Negative for renal artery stenosis by duplex criteria.   No acute renal abnormality.  Echo 08/30/2021: Sonographer Comments: Suboptimal apical window and no subcostal window.  IMPRESSIONS    1. Left ventricular ejection fraction, by estimation, is 55 to 60%. The  left ventricle has normal function. The left ventricle has no regional  wall motion abnormalities. There is moderate asymmetric left ventricular  hypertrophy of the mid to distal  inferior and lateral segments. Left ventricular diastolic parameters are  indeterminate.   2. Right ventricular systolic function is normal. The right ventricular  size is normal.   3. Left atrial size was moderately dilated.   4. The mitral valve is normal in structure. Mild mitral valve  regurgitation. No evidence of mitral stenosis.   5. The aortic valve was not well visualized. Aortic  valve regurgitation  is not visualized. Aortic valve sclerosis is present, with no evidence of  aortic valve stenosis.   6. The inferior vena cava is normal in size with greater than 50%  respiratory variability, suggesting right atrial pressure of 3 mmHg.   EKG:  EKG is personally reviewed. 03/19/2022: EKG was not ordered. 11/06/2021: EKG was not ordered.  Recent Labs: 08/29/2021: B Natriuretic Peptide 251.6; TSH 2.219 08/30/2021: ALT 45 08/31/2021: Hemoglobin 16.6; Magnesium 1.9; Platelets 318 01/08/2022: BUN 21; Creatinine, Ser 1.40; Potassium 4.2; Sodium 135   Recent Lipid Panel No results found for: "CHOL", "TRIG", "HDL", "CHOLHDL", "VLDL", "LDLCALC", "LDLDIRECT"  Physical Exam:    VS:  BP 116/74 (BP Location: Left Wrist, Patient Position: Sitting, Cuff Size: Large)   Pulse 60   Ht 6\' 4"  (1.93 m)   Wt (!) 353 lb 12.8 oz (160.5 kg)   SpO2 98%   BMI 43.07 kg/m  , BMI Body mass index is 43.07 kg/m. GENERAL:   Well appearing HEENT: Pupils equal round and reactive, fundi not visualized, oral mucosa unremarkable NECK:  No jugular venous distention, waveform within normal limits, carotid upstroke brisk and symmetric, no bruits, no thyromegaly LUNGS:  Clear to auscultation bilaterally HEART:  RRR.  PMI not displaced or sustained,S1 and S2 within normal limits, no S3, no S4, no clicks, no rubs, no murmurs ABD:  Flat, positive bowel sounds normal in frequency in pitch, no bruits, no rebound, no guarding, no midline pulsatile mass, no hepatomegaly, no splenomegaly EXT:  2 plus pulses throughout, no edema, no cyanosis no clubbing SKIN:  No rashes no nodules NEURO:  Cranial nerves II through XII grossly intact, motor grossly intact throughout PSYCH:  Cognitively intact, oriented to person place and time   ASSESSMENT/PLAN:    Chronic diastolic heart failure (HCC) Today he is euvolemic and doing well.  His blood pressure is very well controlled.  He was prescribed Marcelline Deist but not taking it due to high co-pay.  He was provided with a co-pay card and will pick it up.  Resistant hypertension Blood pressure is not very well controlled.  There are some discrepancies between his upper and lower arm.  Checked his upper arm and lower arm with auscultation today and both were improved on repeat.  I do not think the lower arm on his automatic machine at home is accurate.  We will not titrate his medications given that his blood pressure was only 117/67 in the office.  Continue Tribenzor and carvedilol.  Check a CMP today.  He was encouraged to keep up his diet and exercise and increase his cardio.  Continue to limit sodium intake.  We will check on the sleep study.  He completed the RPM study and turned in his device today.  Obesity, Class III, BMI 40-49.9 (morbid obesity) (HCC) Keep up the diet and exercise.  Sleep study pending.  Check fasting lipids and a CMP.  His BMI has not completely tiling as he has a high muscle  mass and less body fat.    Screening for Secondary Hypertension:     11/06/2021    3:03 PM  Causes  Drugs/Herbals Screened     - Comments limiting sodium, no caffeine, rare EtOH  Sleep Apnea Screened     - Comments snores and has apnea.  Will get sleep study/    Relevant Labs/Studies:    Latest Ref Rng & Units 01/08/2022    8:40 AM 08/31/2021    9:19 AM 08/30/2021  7:41 AM  Basic Labs  Sodium 135 - 145 mmol/L 135  135  136   Potassium 3.5 - 5.1 mmol/L 4.2  3.5  3.8   Creatinine 0.61 - 1.24 mg/dL 1.40  1.31  1.05        Latest Ref Rng & Units 08/29/2021    8:20 PM  Thyroid   TSH 0.350 - 4.500 uIU/mL 2.219        Latest Ref Rng & Units 08/30/2021    7:41 AM  Renin/Aldosterone   Aldosterone 0.0 - 30.0 ng/dL 1.1      Disposition:    FU with Jimmye Wisnieski C. Oval Linsey, MD, [facility] in 6 months.   Medication Adjustments/Labs and Tests Ordered: Current medicines are reviewed at length with the patient today.  Concerns regarding medicines are outlined above.   Orders Placed This Encounter  Procedures   Lipid panel   Comprehensive metabolic panel   Cantril's Ladder Assessment   No orders of the defined types were placed in this encounter.  I,Mathew Stumpf,acting as a Education administrator for Skeet Latch, MD.,have documented all relevant documentation on the behalf of Skeet Latch, MD,as directed by  Skeet Latch, MD while in the presence of Skeet Latch, MD.  I, Lovejoy Oval Linsey, MD have reviewed all documentation for this visit.  The documentation of the exam, diagnosis, procedures, and orders on 03/19/2022 are all accurate and complete.  Signed, Skeet Latch, MD  03/19/2022 9:02 AM    [place]

## 2022-03-19 ENCOUNTER — Encounter (HOSPITAL_BASED_OUTPATIENT_CLINIC_OR_DEPARTMENT_OTHER): Payer: Self-pay | Admitting: Cardiovascular Disease

## 2022-03-19 ENCOUNTER — Ambulatory Visit (HOSPITAL_BASED_OUTPATIENT_CLINIC_OR_DEPARTMENT_OTHER): Payer: Managed Care, Other (non HMO) | Admitting: Cardiovascular Disease

## 2022-03-19 VITALS — BP 116/74 | HR 60 | Ht 76.0 in | Wt 353.8 lb

## 2022-03-19 DIAGNOSIS — I10 Essential (primary) hypertension: Secondary | ICD-10-CM

## 2022-03-19 DIAGNOSIS — Z5181 Encounter for therapeutic drug level monitoring: Secondary | ICD-10-CM | POA: Diagnosis not present

## 2022-03-19 DIAGNOSIS — Z006 Encounter for examination for normal comparison and control in clinical research program: Secondary | ICD-10-CM

## 2022-03-19 DIAGNOSIS — Z13220 Encounter for screening for lipoid disorders: Secondary | ICD-10-CM

## 2022-03-19 DIAGNOSIS — I5032 Chronic diastolic (congestive) heart failure: Secondary | ICD-10-CM

## 2022-03-19 LAB — COMPREHENSIVE METABOLIC PANEL
ALT: 64 IU/L — ABNORMAL HIGH (ref 0–44)
AST: 49 IU/L — ABNORMAL HIGH (ref 0–40)
Albumin/Globulin Ratio: 1.6 (ref 1.2–2.2)
Albumin: 4.7 g/dL (ref 4.0–5.0)
Alkaline Phosphatase: 61 IU/L (ref 44–121)
BUN/Creatinine Ratio: 10 (ref 9–20)
BUN: 12 mg/dL (ref 6–24)
Bilirubin Total: 0.5 mg/dL (ref 0.0–1.2)
CO2: 23 mmol/L (ref 20–29)
Calcium: 10.2 mg/dL (ref 8.7–10.2)
Chloride: 100 mmol/L (ref 96–106)
Creatinine, Ser: 1.23 mg/dL (ref 0.76–1.27)
Globulin, Total: 3 g/dL (ref 1.5–4.5)
Glucose: 109 mg/dL — ABNORMAL HIGH (ref 70–99)
Potassium: 4.5 mmol/L (ref 3.5–5.2)
Sodium: 137 mmol/L (ref 134–144)
Total Protein: 7.7 g/dL (ref 6.0–8.5)
eGFR: 76 mL/min/{1.73_m2} (ref >59)

## 2022-03-19 LAB — LIPID PANEL
Chol/HDL Ratio: 4.5 ratio (ref 0.0–5.0)
Cholesterol, Total: 191 mg/dL (ref 100–199)
HDL: 42 mg/dL (ref >39)
LDL Chol Calc (NIH): 117 mg/dL — ABNORMAL HIGH (ref 0–99)
Triglycerides: 183 mg/dL — ABNORMAL HIGH (ref 0–149)
VLDL Cholesterol Cal: 32 mg/dL (ref 5–40)

## 2022-03-19 NOTE — Assessment & Plan Note (Signed)
Today he is euvolemic and doing well.  His blood pressure is very well controlled.  He was prescribed Marcelline Deist but not taking it due to high co-pay.  He was provided with a co-pay card and will pick it up.

## 2022-03-19 NOTE — Research (Signed)
I saw pt today after Dr. Blackwood's follow up visit. Pt is in Dr. Highlands's Virtual Care HTN Study. Pt filled out research survey. Pt was enrolled in Group 2. Pt has successfully reached his target blood pressure. Pt has successfully completed the  Virtual Care HTN Study. 

## 2022-03-19 NOTE — Patient Instructions (Signed)
Medication Instructions:  STOP ASPIRIN   Labwork: LP/CMET TODAY  Testing/Procedures: NONE   Follow-Up: 09/18/2022 8:00 am with Dr Duke Salvia   If you need a refill on your cardiac medications before your next appointment, please call your pharmacy.

## 2022-03-19 NOTE — Assessment & Plan Note (Addendum)
Blood pressure is not very well controlled.  There are some discrepancies between his upper and lower arm.  Checked his upper arm and lower arm with auscultation today and both were improved on repeat.  I do not think the lower arm on his automatic machine at home is accurate.  We will not titrate his medications given that his blood pressure was only 117/67 in the office.  Continue Tribenzor and carvedilol.  Check a CMP today.  He was encouraged to keep up his diet and exercise and increase his cardio.  Continue to limit sodium intake.  We will check on the sleep study.  He completed the RPM study and turned in his device today.

## 2022-03-19 NOTE — Assessment & Plan Note (Signed)
Keep up the diet and exercise.  Sleep study pending.  Check fasting lipids and a CMP.  His BMI has not completely tiling as he has a high muscle mass and less body fat.

## 2022-05-25 ENCOUNTER — Ambulatory Visit
Admission: EM | Admit: 2022-05-25 | Discharge: 2022-05-25 | Disposition: A | Discharge status: Home / Self Care | Payer: Managed Care, Other (non HMO) | Attending: Family Medicine | Admitting: Family Medicine

## 2022-05-25 DIAGNOSIS — K629 Disease of anus and rectum, unspecified: Secondary | ICD-10-CM

## 2022-05-25 DIAGNOSIS — K602 Anal fissure, unspecified: Secondary | ICD-10-CM

## 2022-05-25 DIAGNOSIS — K6289 Other specified diseases of anus and rectum: Secondary | ICD-10-CM | POA: Diagnosis not present

## 2022-05-25 MED ORDER — HYDROCORTISONE (PERIANAL) 2.5 % EX CREA: 1.0000 | TOPICAL_CREAM | Freq: Two times a day (BID) | CUTANEOUS | 0 refills | Status: DC | PRN

## 2022-05-25 MED ORDER — HYDROCORTISONE ACE-PRAMOXINE 1-1 % EX CREA: TOPICAL_CREAM | Freq: Three times a day (TID) | CUTANEOUS | 0 refills | Status: DC | PRN

## 2022-05-25 NOTE — ED Triage Notes (Signed)
Patient presents to Urgent Care with complaints of possible hemorrhoids. He states he has been straining and noted blood when he wipes. Has neem treating with OTC hemorrhoid cream with no relief. Unsure if related to rash on his rectum area that has been an on-going issue since he's 20's. PCP prescribed him a cream but states he gets flare-ups.   Denies any concern for STDs.

## 2022-05-25 NOTE — ED Provider Notes (Signed)
[facility]    CSN: [id number] Arrival date & time: 05/25/22  1638      History   Chief Complaint Chief Complaint  Patient presents with   Hemorrhoids    HPI Kevin Morris is a 41 y.o. male.   HPI Patient presents for evaluation for rectal pain, rectal rash, rectal bleeding after wiping  and concern for hemorrhoids. Patient reports that his PCP has been treating a chronic rectal rash with a cream that has been non relieving. The rash has been present since his mid-20's. He reports the rash is now painful although he is uncertain if the pain is related to the development of hemorrhoids. He endorses passage of bowels daily although he admits stools are occasionally harden. No history of recurrent constipation or hemorrhoids. Attempted relief with over the counter hemorrhoids creams without improvement.  Past Medical History:  Diagnosis Date   Apnea 11/06/2021   CHF (congestive heart failure) (HCC)    Generalized headaches    GERD (gastroesophageal reflux disease)    Hypertension     Patient Active Problem List   Diagnosis Date Noted   Apnea 11/06/2021   Resistant hypertension 08/30/2021   Chronic diastolic heart failure (HCC) 08/30/2021   Obesity, Class III, BMI 40-49.9 (morbid obesity) (HCC) 08/30/2021   Needs sleep apnea assessment 08/30/2021   Elevated troponin 08/30/2021   Demand ischemia (HCC) 08/30/2021    Past Surgical History:  Procedure Laterality Date   ACHILLES TENDON SURGERY Right    KNEE ARTHROSCOPY Bilateral    ORIF FINGER / THUMB FRACTURE Bilateral    SHOULDER ARTHROSCOPY WITH LABRAL REPAIR Right        Home Medications    Prior to Admission medications   Medication Sig Start Date End Date Taking? Authorizing Provider  hydrocortisone (ANUSOL-HC) 2.5 % rectal cream Apply 1 Application topically 2 (two) times daily as needed for hemorrhoids or anal itching (Apply directly to area of rectal irritation and rectal tear). 05/25/22  Yes  Bing Neighbors, FNP  pramoxine-hydrocortisone (PROCTOCREAM-HC) 1-1 % rectal cream Place rectally 3 (three) times daily as needed for hemorrhoids or anal itching (apply directly anal tear and rectal anal opening area of irritation). 05/25/22  Yes Bing Neighbors, FNP  albuterol (VENTOLIN HFA) 108 (90 Base) MCG/ACT inhaler INHALE 2 PUFFS INTO THE LUNGS EVERY 6 HOURS AS NEEDED FOR WHEEZING OR SHORTNESS OF BREATH 10/12/21   Margarita Mail, DO  carvedilol (COREG) 6.25 MG tablet Take 1 tablet (6.25 mg total) by mouth 2 (two) times daily. 01/31/22   Chilton Si, MD  dapagliflozin propanediol (FARXIGA) 10 MG TABS tablet Take 1 tablet (10 mg total) by mouth daily before breakfast. Patient not taking: Reported on 03/19/2022 12/11/21   Delma Freeze, FNP  naproxen (NAPROSYN) 500 MG tablet Take 1 tablet (500 mg total) by mouth 2 (two) times daily with a meal. 01/22/22   Rumball, Darl Householder, DO  Olmesartan-amLODIPine-HCTZ 40-10-25 MG TABS Take 1 tablet by mouth daily. 11/06/21   Chilton Si, MD  pantoprazole (PROTONIX) 40 MG tablet Take 1 tablet (40 mg total) by mouth daily. Patient taking differently: Take 40 mg by mouth as needed. 10/19/21   Margarita Mail, DO  tiZANidine (ZANAFLEX) 4 MG tablet Take 1 tablet (4 mg total) by mouth every 6 (six) hours as needed for muscle spasms. 02/15/22   Margarita Mail, DO    Family History Family History  Problem Relation Age of Onset   Hypertension Mother    Cancer Maternal Grandmother  breast   Diabetes Maternal Grandmother    Stroke Maternal Grandmother     Social History Social History   Tobacco Use   Smoking status: Former    Types: Cigars   Tobacco comments:    Very occasional cigar use  Vaping Use   Vaping Use: Never used  Substance Use Topics   Alcohol use: Yes    Comment: occassional   Drug use: Never     Allergies   Iodinated contrast media and Shellfish-derived products   Review of Systems Review of  Systems Pertinent negatives listed in HPI   Physical Exam Triage Vital Signs ED Triage Vitals [05/25/22 0856]  Enc Vitals Group     BP (!) 146/88     Pulse Rate 81     Resp 16     Temp 98.2 F (36.8 C)     Temp Source Temporal     SpO2 98 %     Weight      Height      Head Circumference      Peak Flow      Pain Score 0     Pain Loc      Pain Edu?      Excl. in GC?    No data found.  Updated Vital Signs BP (!) 146/88 (BP Location: Left Arm)   Pulse 81   Temp 98.2 F (36.8 C) (Temporal)   Resp 16   SpO2 98%   Visual Acuity Right Eye Distance:   Left Eye Distance:   Bilateral Distance:    Right Eye Near:   Left Eye Near:    Bilateral Near:     Physical Exam Vitals reviewed. Exam conducted with a chaperone present.  Constitutional:      Appearance: Normal appearance.  HENT:     Head: Normocephalic and atraumatic.  Eyes:     Extraocular Movements: Extraocular movements intact.     Pupils: Pupils are equal, round, and reactive to light.  Cardiovascular:     Rate and Rhythm: Normal rate and regular rhythm.  Pulmonary:     Effort: Pulmonary effort is normal.     Breath sounds: Normal breath sounds.  Genitourinary:    Rectum: Tenderness and anal fissure present.     Comments: Fleshy colored, irregularly distributed, mass like lesion on bilateral gluteal folds extending into anal opening. Lesions are excoriated with scant bleeding.  Musculoskeletal:        General: Normal range of motion.     Cervical back: Normal range of motion and neck supple.  Skin:    General: Skin is warm and dry.     Capillary Refill: Capillary refill takes less than 2 seconds.  Neurological:     General: No focal deficit present.     Mental Status: He is alert and oriented to person, place, and time. Mental status is at baseline.      UC Treatments / Results  Labs (all labs ordered are listed, but only abnormal results are displayed) Labs Reviewed - No data to  display  EKG   Radiology No results found.  Procedures Procedures (including critical care time)  Medications Ordered in UC Medications - No data to display  Initial Impression / Assessment and Plan / UC Course  I have reviewed the triage vital signs and the nursing notes.  Pertinent labs & imaging results that were available during my care of the patient were reviewed by me and considered in my medical decision making (see chart  for details).    No hemorrhoids present on exam. 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 Patient also has anal fissure at distal end of anal opening and diffuse erythema and tenderness exam, therefore will trial hydrocortisone suppositories and topical cream for management of symptoms, while General Surgery consult is pending for further evaluation and rectal lesion. Patient verbalized understanding and agreement with plan. Final Clinical Impressions(s) / UC Diagnoses   Final diagnoses:  Anal fissure  Rectal irritation  Anal lesion     Discharge Instructions      I have referred you to general surgery to have the rectal biopsy performed. In the meantime use the suppository for internal anal irritation and apply cream externally.  Recommend stool softener to soften stools to reduce irritation.    ED Prescriptions     Medication Sig Dispense Auth. Provider   hydrocortisone (ANUSOL-HC) 2.5 % rectal cream Apply 1 Application topically 2 (two) times daily as needed for hemorrhoids or anal itching (Apply directly to area of rectal irritation and rectal tear). 30 g Bing Neighbors, FNP   pramoxine-hydrocortisone (PROCTOCREAM-HC) 1-1 % rectal cream Place rectally 3 (three) times daily as needed for hemorrhoids or anal itching (apply directly anal tear and rectal anal opening area of irritation). 30 g Bing Neighbors, FNP      PDMP not reviewed this encounter.    Bing Neighbors, FNP 05/25/22 1023

## 2022-05-25 NOTE — Discharge Instructions (Addendum)
I have referred you to general surgery to have the rectal biopsy performed. In the meantime use the suppository for internal anal irritation and apply cream externally.  Recommend stool softener to soften stools to reduce irritation.

## 2022-05-30 ENCOUNTER — Ambulatory Visit: Payer: Self-pay | Admitting: Surgery

## 2022-05-30 ENCOUNTER — Telehealth: Payer: Self-pay | Admitting: Surgery

## 2022-05-30 ENCOUNTER — Encounter: Payer: Self-pay | Admitting: Surgery

## 2022-05-30 ENCOUNTER — Ambulatory Visit: Payer: Managed Care, Other (non HMO) | Admitting: Surgery

## 2022-05-30 VITALS — BP 147/75 | HR 68 | Temp 98.3°F | Ht 76.0 in | Wt 334.0 lb

## 2022-05-30 DIAGNOSIS — A630 Anogenital (venereal) warts: Secondary | ICD-10-CM | POA: Diagnosis not present

## 2022-05-30 NOTE — Telephone Encounter (Signed)
Patient has been advised of Pre-Admission date/time, and Surgery date.  Surgery Date: 06/05/22 Preadmission Testing Date: 06/04/22 (phone 8a-1p)  Patient has been made aware to call [phone], between 1-3:00pm the day before surgery, to find out what time to arrive for surgery.

## 2022-05-30 NOTE — Patient Instructions (Signed)
Our surgery scheduler will call you to look at surgery dates and to go over information. Please see your Blue surgery sheet for information.   You have requested to have Rectal surgery today. This will be scheduled at [facility] with [place].   Please review the information below and your [facility] Information. Our surgery scheduler will call you to review surgery date and to go over information.  Constipation is going to be your biggest obstacle following surgery. Use all stool softeners and laxatives as prescribed after your surgery and be sure to drink 72 ounces of water or more every day. This will help to avoid constipation. If you do all of this and you are still having difficulty, please call our office for further instructions as soon as you begin to have difficulty with bowel movements.  You may want to buy a disposable Sitz bath prior to surgery to aid in pain and cleanliness after surgery. The information on how to do use a disposable sitz bath or using a bath tub are below.  You will be out of work 1-2 weeks depending on how your healing goes. If you have FMLA/Disability paperwork that needs filled out you may drop this off at the office or fax it to [phone].   Rectal Surgery After Care Refer to this sheet in the next few weeks. These instructions provide you with information about caring for yourself after your procedure. Your health care provider may also give you more specific instructions. Your treatment has been planned according to current medical practices, but problems sometimes occur. Call your health care provider if you have any problems or questions after your procedure. What can I expect after the procedure? After the procedure, it is common to have: Rectal pain. Pain when you are having a bowel movement. Slight rectal bleeding.  Follow these instructions at home: Medicines Take over-the-counter and prescription medicines only as told by your health care  provider. Do not drive or operate heavy machinery while taking prescription pain medicine. Use a stool softener or a bulk laxative as told by your health care provider. Activity Rest at home. Return to your normal activities as told by your health care provider. Do not lift anything that is heavier than 10 lb (4.5 kg). Do not sit for long periods of time. Take a walk every day or as told by your health care provider. Do not strain to have a bowel movement. Do not spend a long time sitting on the toilet. Eating and drinking Eat foods that contain fiber, such as whole grains, beans, nuts, fruits, and vegetables. Drink enough fluid to keep your urine clear or pale yellow. General instructions Sit in a warm bath 2-3 times per day to relieve soreness or itching. Keep all follow-up visits as told by your health care provider. This is important. Contact a health care provider if: Your pain medicine is not helping. You have a fever or chills. You become constipated. You have trouble passing urine. Get help right away if: You have very bad rectal pain. You have heavy bleeding from your rectum. This information is not intended to replace advice given to you by your health care provider. Make sure you discuss any questions you have with your health care provider. Document Released: 12/14/2003 Document Revised: 02/29/2016 Document Reviewed: 12/19/2014 Elsevier Interactive Patient Education  2018 [employer].   Disposable Sitz Bath A disposable sitz bath is a plastic basin that fits over the toilet. A bag is hung above the toilet, and  the bag is connected to a tube that opens into the basin. The bag is filled with warm water that flows into the basin through the tube. A sitz bath can be used to help relieve symptoms, clean, and promote healing in the genital and anal areas, as well as in the lower abdomen and buttocks. What are the risks? Sitz baths are generally very safe. It is possible for the  skin between the genitals and the anus (perineum) to become infected, but this is rare. You can avoid this by cleaning your sitz bath supplies thoroughly. How to use a disposable sitz bath Close the clamp on the tube. Make sure the clamp is closed tightly to prevent leakage. Fill the sitz bath basin and the plastic bag with warm water. The water should be warm enough to be comfortable, but not hot. Raise the toilet seat and place the filled basin on the toilet. Make sure the overflow opening is facing toward the back of the toilet. If you prefer, you may place the empty basin on the toilet first, and then use the plastic bag to fill the basin with warm water. Hang the filled plastic bag overhead on a hook or towel rack close to the toilet. The bag should be higher than the toilet so that the water will flow down through the tube. Attach the tube to the opening on the basin. Make sure that the tube is attached to the basin tightly to prevent leakage. Sit on the basin and release the clamp. This will allow warm water to flow into the basin and flush the area around your genitals and anus. Remain sitting on the basin for about 15-20 minutes, or as long as told by your health care provider. Stand up and gently pat your skin dry. If directed, apply clean bandages (dressings) to the affected area as told by your health care provider. Carefully remove the basin from the toilet seat and tip the basin into the toilet to empty any remaining water. Empty any remaining water from the plastic bag into the toilet. Then, flush the toilet. Wash the basin with warm water and soap. Let the basin air dry in the sink. You should also let the plastic bag and the tubing air dry. Store the basin, tubing, and plastic bag in a clean, dry area. Wash your hands with soap and water. If soap and water are not available, use hand sanitizer. Contact a health care provider if: You have symptoms that get worse instead of  better. You develop new skin irritation, redness, or swelling around your genitals or anus. This information is not intended to replace advice given to you by your health care provider. Make sure you discuss any questions you have with your health care provider. Document Released: 03/24/2012 Document Revised: 02/29/2016 Document Reviewed: 08/13/2015 Elsevier Interactive Patient Education  2018 [employer].   How to Take a [employer] A sitz bath is a warm water bath that is taken while you are sitting down. The water should only come up to your hips and should cover your buttocks. Your health care provider may recommend a sitz bath to help you: Clean the lower part of your body, including your genital area. With itching. With pain. With sore muscles or muscles that tighten or spasm.  How to take a sitz bath Take 3-4 sitz baths per day or as told by your health care provider. Partially fill a bathtub with warm water. You will only need the water to  be deep enough to cover your hips and buttocks when you are sitting in it. If your health care provider told you to put medicine in the water, follow the directions exactly. Sit in the water and open the tub drain a little. Turn on the warm water again to keep the tub at the correct level. Keep the water running constantly. Soak in the water for 15-20 minutes or as told by your health care provider. After the sitz bath, pat the affected area dry first. Do not rub it. Be careful when you stand up after the sitz bath because you may feel dizzy.  Contact a health care provider if: Your symptoms get worse. Do not continue with sitz baths if your symptoms get worse. You have new symptoms. Do not continue with sitz baths until you talk with your health care provider. This information is not intended to replace advice given to you by your health care provider. Make sure you discuss any questions you have with your health care provider. Document  Released: 06/15/2004 Document Revised: 02/21/2016 Document Reviewed: 09/21/2014 Elsevier Interactive Patient Education  [employer].

## 2022-05-30 NOTE — H&P (View-Only) (Signed)
Patient ID: Kevin Morris, male   DOB: May 07, 1981, 41 y.o.   MRN: [id number]  Chief Complaint: Gluteal cleft dermal lesions  History of Present Illness Kevin Morris is a 41 y.o. male with a long history, 20 years of skin lesions of the gluteal cleft perianal area.  Annoying, exacerbated with wiping and cleansing walking.  Denies significant bleeding, notes some recent progression and desires to proceed with excision.  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   CHF (congestive heart failure) (HCC)    Generalized headaches    GERD (gastroesophageal reflux disease)    Hypertension       Past Surgical History:  Procedure Laterality Date   ACHILLES TENDON SURGERY Right    KNEE ARTHROSCOPY Bilateral    ORIF FINGER / THUMB FRACTURE Bilateral    PILONIDAL CYST EXCISION     SHOULDER ARTHROSCOPY WITH LABRAL REPAIR Right     Allergies  Allergen Reactions   Iodinated Contrast Media Anaphylaxis   Shellfish-Derived Products Anaphylaxis    Current Outpatient Medications  Medication Sig Dispense Refill   albuterol (VENTOLIN HFA) 108 (90 Base) MCG/ACT inhaler INHALE 2 PUFFS INTO THE LUNGS EVERY 6 HOURS AS NEEDED FOR WHEEZING OR SHORTNESS OF BREATH 24 g 0   carvedilol (COREG) 6.25 MG tablet Take 1 tablet (6.25 mg total) by mouth 2 (two) times daily. 180 tablet 3   dapagliflozin propanediol (FARXIGA) 10 MG TABS tablet Take 1 tablet (10 mg total) by mouth daily before breakfast. 30 tablet 5   hydrocortisone (ANUSOL-HC) 2.5 % rectal cream Apply 1 Application topically 2 (two) times daily as needed for hemorrhoids or anal itching (Apply directly to area of rectal irritation and rectal tear). 30 g 0   naproxen (NAPROSYN) 500 MG tablet Take 1 tablet (500 mg total) by mouth 2 (two) times daily with a meal. 30 tablet 0   Olmesartan-amLODIPine-HCTZ 40-10-25 MG TABS Take 1 tablet by mouth daily. 90 tablet 3   pantoprazole (PROTONIX) 40 MG tablet Take 1 tablet (40  mg total) by mouth daily. (Patient taking differently: Take 40 mg by mouth as needed.) 90 tablet 3   pramoxine-hydrocortisone (PROCTOCREAM-HC) 1-1 % rectal cream Place rectally 3 (three) times daily as needed for hemorrhoids or anal itching (apply directly anal tear and rectal anal opening area of irritation). 30 g 0   tiZANidine (ZANAFLEX) 4 MG tablet Take 1 tablet (4 mg total) by mouth every 6 (six) hours as needed for muscle spasms. 30 tablet 0   No current facility-administered medications for this visit.    Family History Family History  Problem Relation Age of Onset   Hypertension Mother    Cancer Maternal Grandmother        breast   Diabetes Maternal Grandmother    Stroke Maternal Grandmother       Social History Social History   Tobacco Use   Smoking status: Former    Types: Cigars    Passive exposure: Past   Tobacco comments:    Very occasional cigar use  Vaping Use   Vaping Use: Never used  Substance Use Topics   Alcohol use: Yes    Comment: occassional   Drug use: Never        Review of Systems  Constitutional: Negative.   HENT: Negative.    Eyes: Negative.   Respiratory: Negative.    Cardiovascular: Negative.   Gastrointestinal: Negative.   Genitourinary: Negative.   Skin:  Positive for rash.  Neurological: Negative.  Psychiatric/Behavioral: Negative.        Physical Exam Blood pressure (!) 147/75, pulse 68, temperature 98.3 F (36.8 C), height 6' 4" (1.93 m), weight (!) 334 lb (151.5 kg), SpO2 98 %. Last Weight  Most recent update: 05/30/2022 11:22 AM    Weight  151.5 kg (334 lb)               CONSTITUTIONAL: Well developed, and nourished, appropriately responsive and aware without distress.   EYES: Sclera non-icteric.   EARS, NOSE, MOUTH AND THROAT:  The oropharynx is clear. Oral mucosa is pink and moist.   Hearing is intact to voice.  NECK: Trachea is midline, and there is no jugular venous distension.  LYMPH NODES:  Lymph nodes in the  neck are not enlarged. RESPIRATORY:  Lungs are clear, and breath sounds are equal bilaterally. Normal respiratory effort without pathologic use of accessory muscles. CARDIOVASCULAR: Heart is regular in rate and rhythm. GI: The abdomen is  soft, nontender, and nondistended. There were no palpable masses. I did not appreciate hepatosplenomegaly. There were normal bowel sounds. GU: The natal cleft has flat condylomatous lesions primarily centered around the perianal area, with a surprising paucity immediately adjacent to the anus.  DRE was attempted but patient is intolerant.  Based on what he reports as likely sources suspect no intra-anal lesions.  Chaperone present.  Considering an 8 cm radius from the anus I suspect these warts cover 15 to 20% of the area. MUSCULOSKELETAL:  Symmetrical muscle tone appreciated in all four extremities.    SKIN: Skin turgor is normal. No pathologic skin lesions appreciated.  NEUROLOGIC:  Motor and sensation appear grossly normal.  Cranial nerves are grossly without defect. PSYCH:  Alert and oriented to person, place and time. Affect is appropriate for situation.  Data Reviewed I have personally reviewed what is currently available of the patient's imaging, recent labs and medical records.   Labs:     Latest Ref Rng & Units 08/31/2021    9:19 AM 08/30/2021    7:41 AM 08/29/2021    1:16 PM  CBC  WBC 4.0 - 10.5 K/uL 7.6  5.5  6.1   Hemoglobin 13.0 - 17.0 g/dL 16.6  15.3  15.4   Hematocrit 39.0 - 52.0 % 47.4  43.0  42.8   Platelets 150 - 400 K/uL 318  251  265       Latest Ref Rng & Units 03/19/2022    8:54 AM 01/08/2022    8:40 AM 08/31/2021    9:19 AM  CMP  Glucose 70 - 99 mg/dL 109  117  187   BUN 6 - 24 mg/dL 12  21  13   Creatinine 0.76 - 1.27 mg/dL 1.23  1.40  1.31   Sodium 134 - 144 mmol/L 137  135  135   Potassium 3.5 - 5.2 mmol/L 4.5  4.2  3.5   Chloride 96 - 106 mmol/L 100  99  98   CO2 20 - 29 mmol/L 23  25  27   Calcium 8.7 - 10.2 mg/dL 10.2   9.5  9.8   Total Protein 6.0 - 8.5 g/dL 7.7     Total Bilirubin 0.0 - 1.2 mg/dL 0.5     Alkaline Phos 44 - 121 IU/L 61     AST 0 - 40 IU/L 49     ALT 0 - 44 IU/L 64         Imaging:  Within last 24 hrs: No results   found.  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  We discussed the etiology, role of excision, potential for recurrence, wound care potential for infection, risks of bleeding etc.  The patient is highly motivated and desires to proceed with excision.  Risks of been discussed and accepted with his questions being adequately answered to his satisfaction.  No guarantees were expressed or implied.  Face-to-face time spent with the patient and accompanying care providers(if present) was 30 minutes, with more than 50% of the time spent counseling, educating, and coordinating care of the patient.    These notes generated with voice recognition software. I apologize for typographical errors.  Campbell Lerner M.D., FACS 05/30/2022, 1:33 PM

## 2022-05-30 NOTE — Progress Notes (Signed)
Patient ID: Kevin Morris, male   DOB: May 07, 1981, 41 y.o.   MRN: [id number]  Chief Complaint: Gluteal cleft dermal lesions  History of Present Illness Kevin Morris is a 41 y.o. male with a long history, 20 years of skin lesions of the gluteal cleft perianal area.  Annoying, exacerbated with wiping and cleansing walking.  Denies significant bleeding, notes some recent progression and desires to proceed with excision.  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   CHF (congestive heart failure) (HCC)    Generalized headaches    GERD (gastroesophageal reflux disease)    Hypertension       Past Surgical History:  Procedure Laterality Date   ACHILLES TENDON SURGERY Right    KNEE ARTHROSCOPY Bilateral    ORIF FINGER / THUMB FRACTURE Bilateral    PILONIDAL CYST EXCISION     SHOULDER ARTHROSCOPY WITH LABRAL REPAIR Right     Allergies  Allergen Reactions   Iodinated Contrast Media Anaphylaxis   Shellfish-Derived Products Anaphylaxis    Current Outpatient Medications  Medication Sig Dispense Refill   albuterol (VENTOLIN HFA) 108 (90 Base) MCG/ACT inhaler INHALE 2 PUFFS INTO THE LUNGS EVERY 6 HOURS AS NEEDED FOR WHEEZING OR SHORTNESS OF BREATH 24 g 0   carvedilol (COREG) 6.25 MG tablet Take 1 tablet (6.25 mg total) by mouth 2 (two) times daily. 180 tablet 3   dapagliflozin propanediol (FARXIGA) 10 MG TABS tablet Take 1 tablet (10 mg total) by mouth daily before breakfast. 30 tablet 5   hydrocortisone (ANUSOL-HC) 2.5 % rectal cream Apply 1 Application topically 2 (two) times daily as needed for hemorrhoids or anal itching (Apply directly to area of rectal irritation and rectal tear). 30 g 0   naproxen (NAPROSYN) 500 MG tablet Take 1 tablet (500 mg total) by mouth 2 (two) times daily with a meal. 30 tablet 0   Olmesartan-amLODIPine-HCTZ 40-10-25 MG TABS Take 1 tablet by mouth daily. 90 tablet 3   pantoprazole (PROTONIX) 40 MG tablet Take 1 tablet (40  mg total) by mouth daily. (Patient taking differently: Take 40 mg by mouth as needed.) 90 tablet 3   pramoxine-hydrocortisone (PROCTOCREAM-HC) 1-1 % rectal cream Place rectally 3 (three) times daily as needed for hemorrhoids or anal itching (apply directly anal tear and rectal anal opening area of irritation). 30 g 0   tiZANidine (ZANAFLEX) 4 MG tablet Take 1 tablet (4 mg total) by mouth every 6 (six) hours as needed for muscle spasms. 30 tablet 0   No current facility-administered medications for this visit.    Family History Family History  Problem Relation Age of Onset   Hypertension Mother    Cancer Maternal Grandmother        breast   Diabetes Maternal Grandmother    Stroke Maternal Grandmother       Social History Social History   Tobacco Use   Smoking status: Former    Types: Cigars    Passive exposure: Past   Tobacco comments:    Very occasional cigar use  Vaping Use   Vaping Use: Never used  Substance Use Topics   Alcohol use: Yes    Comment: occassional   Drug use: Never        Review of Systems  Constitutional: Negative.   HENT: Negative.    Eyes: Negative.   Respiratory: Negative.    Cardiovascular: Negative.   Gastrointestinal: Negative.   Genitourinary: Negative.   Skin:  Positive for rash.  Neurological: Negative.  Psychiatric/Behavioral: Negative.        Physical Exam Blood pressure (!) 147/75, pulse 68, temperature 98.3 F (36.8 C), height 6\' 4"  (1.93 m), weight (!) 334 lb (151.5 kg), SpO2 98 %. Last Weight  Most recent update: 05/30/2022 11:22 AM    Weight  151.5 kg (334 lb)               CONSTITUTIONAL: Well developed, and nourished, appropriately responsive and aware without distress.   EYES: Sclera non-icteric.   EARS, NOSE, MOUTH AND THROAT:  The oropharynx is clear. Oral mucosa is pink and moist.   Hearing is intact to voice.  NECK: Trachea is midline, and there is no jugular venous distension.  LYMPH NODES:  Lymph nodes in the  neck are not enlarged. RESPIRATORY:  Lungs are clear, and breath sounds are equal bilaterally. Normal respiratory effort without pathologic use of accessory muscles. CARDIOVASCULAR: Heart is regular in rate and rhythm. GI: The abdomen is  soft, nontender, and nondistended. There were no palpable masses. I did not appreciate hepatosplenomegaly. There were normal bowel sounds. GU: The natal cleft has flat condylomatous lesions primarily centered around the perianal area, with a surprising paucity immediately adjacent to the anus.  DRE was attempted but patient is intolerant.  Based on what he reports as likely sources suspect no intra-anal lesions.  Chaperone present.  Considering an 8 cm radius from the anus I suspect these warts cover 15 to 20% of the area. MUSCULOSKELETAL:  Symmetrical muscle tone appreciated in all four extremities.    SKIN: Skin turgor is normal. No pathologic skin lesions appreciated.  NEUROLOGIC:  Motor and sensation appear grossly normal.  Cranial nerves are grossly without defect. PSYCH:  Alert and oriented to person, place and time. Affect is appropriate for situation.  Data Reviewed I have personally reviewed what is currently available of the patient's imaging, recent labs and medical records.   Labs:     Latest Ref Rng & Units 08/31/2021    9:19 AM 08/30/2021    7:41 AM 08/29/2021    1:16 PM  CBC  WBC 4.0 - 10.5 K/uL 7.6  5.5  6.1   Hemoglobin 13.0 - 17.0 g/dL 08/31/2021  16.3  84.6   Hematocrit 39.0 - 52.0 % 47.4  43.0  42.8   Platelets 150 - 400 K/uL 318  251  265       Latest Ref Rng & Units 03/19/2022    8:54 AM 01/08/2022    8:40 AM 08/31/2021    9:19 AM  CMP  Glucose 70 - 99 mg/dL 09/02/2021  935  701   BUN 6 - 24 mg/dL 12  21  13    Creatinine 0.76 - 1.27 mg/dL 779   3.90   Sodium 134 - 144 mmol/L 137  135  135   Potassium 3.5 - 5.2 mmol/L 4.5  4.2  3.5   Chloride 96 - 106 mmol/L 100  99  98   CO2 20 - 29 mmol/L 23  25  27    Calcium 8.7 - 10.2 mg/dL 3.00   9.5  9.8   Total Protein 6.0 - 8.5 g/dL 7.7     Total Bilirubin 0.0 - 1.2 mg/dL 0.5     Alkaline Phos 44 - 121 IU/L 61     AST 0 - 40 IU/L 49     ALT 0 - 44 IU/L 64         Imaging:  Within last 24 hrs: No results  found.  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  We discussed the etiology, role of excision, potential for recurrence, wound care potential for infection, risks of bleeding etc.  The patient is highly motivated and desires to proceed with excision.  Risks of been discussed and accepted with his questions being adequately answered to his satisfaction.  No guarantees were expressed or implied.  Face-to-face time spent with the patient and accompanying care providers(if present) was 30 minutes, with more than 50% of the time spent counseling, educating, and coordinating care of the patient.    These notes generated with voice recognition software. I apologize for typographical errors.  Campbell Lerner M.D., FACS 05/30/2022, 1:33 PM

## 2022-06-04 ENCOUNTER — Encounter
Admission: RE | Admit: 2022-06-04 | Discharge: 2022-06-04 | Disposition: A | Discharge status: Home / Self Care | Payer: Managed Care, Other (non HMO) | Source: Ambulatory Visit | Attending: Surgery | Admitting: Surgery

## 2022-06-04 VITALS — Ht 76.0 in | Wt 333.0 lb

## 2022-06-04 DIAGNOSIS — Z01818 Encounter for other preprocedural examination: Secondary | ICD-10-CM | POA: Insufficient documentation

## 2022-06-04 DIAGNOSIS — I10 Essential (primary) hypertension: Secondary | ICD-10-CM

## 2022-06-04 DIAGNOSIS — I5032 Chronic diastolic (congestive) heart failure: Secondary | ICD-10-CM | POA: Diagnosis not present

## 2022-06-04 DIAGNOSIS — A630 Anogenital (venereal) warts: Secondary | ICD-10-CM | POA: Insufficient documentation

## 2022-06-04 DIAGNOSIS — I110 Hypertensive heart disease with heart failure: Secondary | ICD-10-CM | POA: Insufficient documentation

## 2022-06-04 LAB — CBC WITH DIFFERENTIAL/PLATELET
Abs Immature Granulocytes: 0.01 10*3/uL (ref 0.00–0.07)
Basophils Absolute: 0 10*3/uL (ref 0.0–0.1)
Basophils Relative: 0 %
Eosinophils Absolute: 0 10*3/uL (ref 0.0–0.5)
Eosinophils Relative: 0 %
HCT: 46.3 % (ref 39.0–52.0)
Hemoglobin: 16.1 g/dL (ref 13.0–17.0)
Immature Granulocytes: 0 %
Lymphocytes Relative: 43 %
Lymphs Abs: 3.1 10*3/uL (ref 0.7–4.0)
MCH: 31.2 pg (ref 26.0–34.0)
MCHC: 34.8 g/dL (ref 30.0–36.0)
MCV: 89.7 fL (ref 80.0–100.0)
Monocytes Absolute: 0.5 10*3/uL (ref 0.1–1.0)
Monocytes Relative: 7 %
Neutro Abs: 3.5 10*3/uL (ref 1.7–7.7)
Neutrophils Relative %: 50 %
Platelets: 335 10*3/uL (ref 150–400)
RBC: 5.16 MIL/uL (ref 4.22–5.81)
RDW: 11.6 % (ref 11.5–15.5)
WBC: 7.2 10*3/uL (ref 4.0–10.5)
nRBC: 0 % (ref 0.0–0.2)

## 2022-06-04 LAB — COMPREHENSIVE METABOLIC PANEL
ALT: 53 U/L — ABNORMAL HIGH (ref 0–44)
AST: 40 U/L (ref 15–41)
Albumin: 4.7 g/dL (ref 3.5–5.0)
Alkaline Phosphatase: 51 U/L (ref 38–126)
Anion gap: 10 (ref 5–15)
BUN: 18 mg/dL (ref 6–20)
CO2: 27 mmol/L (ref 22–32)
Calcium: 10 mg/dL (ref 8.9–10.3)
Chloride: 102 mmol/L (ref 98–111)
Creatinine, Ser: 1.2 mg/dL (ref 0.61–1.24)
GFR, Estimated: >60 mL/min (ref >60)
Glucose, Bld: 96 mg/dL (ref 70–99)
Potassium: 3.8 mmol/L (ref 3.5–5.1)
Sodium: 139 mmol/L (ref 135–145)
Total Bilirubin: 1.2 mg/dL (ref 0.3–1.2)
Total Protein: 8.5 g/dL — ABNORMAL HIGH (ref 6.5–8.1)

## 2022-06-04 MED ORDER — GABAPENTIN 300 MG PO CAPS: 300.0000 mg | ORAL_CAPSULE | ORAL | Status: AC

## 2022-06-04 MED ORDER — CHLORHEXIDINE GLUCONATE CLOTH 2 % EX PADS: 6.0000 | MEDICATED_PAD | Freq: Once | CUTANEOUS | Status: DC

## 2022-06-04 MED ORDER — ORAL CARE MOUTH RINSE: 15.0000 mL | Freq: Once | OROMUCOSAL | Status: AC

## 2022-06-04 MED ORDER — CHLORHEXIDINE GLUCONATE 0.12 % MT SOLN: 15.0000 mL | Freq: Once | OROMUCOSAL | Status: AC

## 2022-06-04 MED ORDER — FLEET ENEMA 7-19 GM/118ML RE ENEM: 1.0000 | ENEMA | Freq: Once | RECTAL | Status: DC

## 2022-06-04 MED ORDER — LACTATED RINGERS IV SOLN: INTRAVENOUS | Status: DC

## 2022-06-04 MED ORDER — ACETAMINOPHEN 500 MG PO TABS: 1000.0000 mg | ORAL_TABLET | ORAL | Status: AC

## 2022-06-04 MED ORDER — BUPIVACAINE LIPOSOME 1.3 % IJ SUSP: 20.0000 mL | Freq: Once | INTRAMUSCULAR | Status: DC

## 2022-06-04 NOTE — Patient Instructions (Addendum)
Your procedure is scheduled on: Wednesday June 05, 2022. Report to Day [facility] inside [facility] 2nd floor, stop by admissions desk before getting on elevator. To find out your arrival time please call [phone] between 1PM - 3PM on Tuesday June 04, 2022.  Remember: Instructions that are not followed completely may result in serious medical risk,  up to and including death, or upon the discretion of your surgeon and anesthesiologist your  surgery may need to be rescheduled.     _X__ 1. Do not eat food or drink fluids after midnight the night before your procedure.                 No chewing gum or hard candies.   __X__2.  On the morning of surgery brush your teeth with toothpaste and water, you                may rinse your mouth with mouthwash if you wish.  Do not swallow any toothpaste or mouthwash.     _X__ 3.  No Alcohol for 24 hours before or after surgery.   _X__ 4.  Do Not Smoke or use e-cigarettes For 24 Hours Prior to Your Surgery.                 Do not use any chewable tobacco products for at least 6 hours prior to                 Surgery.  _X__  5.  Do not use any recreational drugs (marijuana, cocaine, heroin, ecstasy, MDMA or other)                For at least one week prior to your surgery.  Combination of these drugs with anesthesia                May have life threatening results.  ____  6.  Bring all medications with you on the day of surgery if instructed.   __X__  7.  Notify your doctor if there is any change in your medical condition      (cold, fever, infections).     Do not wear jewelry, make-up, hairpins, clips or nail polish. Do not wear lotions, powders, or perfumes. You may wear deodorant. Do not shave 48 hours prior to surgery. Men may shave face and neck. Do not bring valuables to the hospital.    [facility] is not responsible for any belongings or valuables.  Contacts, dentures or bridgework may not be worn into  surgery. Leave your suitcase in the car. After surgery it may be brought to your room. For patients admitted to the hospital, discharge time is determined by your treatment team.   Patients discharged the day of surgery will not be allowed to drive home.   Make arrangements for someone to be with you for the first 24 hours of your Same Day Discharge.   __X__ Take these medicines the morning of surgery with A SIP OF WATER:    1. carvedilol (COREG) 6.25 MG tablet  2. pantoprazole (PROTONIX) 40 MG   3.   4.  5.  6.  __X__ Fleet Enema (use 1 the night before and the other morning of procedure before getting in shower)  _X___ Use CHG Soap (or wipes) as directed  ____ Use Benzoyl Peroxide Gel as instructed  ____ Use inhalers on the day of surgery  __X__ Stop dapagliflozin propanediol (FARXIGA) 10 MG TABS   prior to surgery  ____ Take 1/2 of usual insulin dose the night before surgery. No insulin the morning          of surgery.   ____ Call your PCP, cardiologist, or Pulmonologist if taking Coumadin/Plavix/aspirin and ask when to stop before your surgery.   __X__ One Week prior to surgery- Stop Anti-inflammatories such as Ibuprofen, Aleve, Advil, Motrin, meloxicam (MOBIC), diclofenac, etodolac, ketorolac, Toradol, Daypro, piroxicam, Goody's or BC powders. OK TO USE TYLENOL IF NEEDED   __X__ Stop supplements until after surgery.    ____ Bring C-Pap to the hospital.    If you have any questions regarding your pre-procedure instructions,  Please call Pre-admit Testing at [phone]

## 2022-06-05 ENCOUNTER — Ambulatory Visit
Admission: RE | Admit: 2022-06-05 | Discharge: 2022-06-05 | Disposition: A | Discharge status: Home / Self Care | Payer: Managed Care, Other (non HMO) | Attending: Surgery | Admitting: Surgery

## 2022-06-05 ENCOUNTER — Ambulatory Visit: Payer: Managed Care, Other (non HMO) | Admitting: General Practice

## 2022-06-05 ENCOUNTER — Other Ambulatory Visit: Payer: Self-pay

## 2022-06-05 ENCOUNTER — Encounter: Payer: Self-pay | Admitting: Surgery

## 2022-06-05 ENCOUNTER — Encounter
Admission: RE | Disposition: A | Discharge status: Home / Self Care | Payer: Self-pay | Source: Home / Self Care | Attending: Surgery

## 2022-06-05 DIAGNOSIS — I509 Heart failure, unspecified: Secondary | ICD-10-CM | POA: Insufficient documentation

## 2022-06-05 DIAGNOSIS — I110 Hypertensive heart disease with heart failure: Secondary | ICD-10-CM | POA: Diagnosis not present

## 2022-06-05 DIAGNOSIS — A630 Anogenital (venereal) warts: Secondary | ICD-10-CM | POA: Diagnosis present

## 2022-06-05 DIAGNOSIS — K219 Gastro-esophageal reflux disease without esophagitis: Secondary | ICD-10-CM | POA: Diagnosis not present

## 2022-06-05 HISTORY — PX: CONDYLOMA EXCISION/FULGURATION: SHX1389

## 2022-06-05 SURGERY — REMOVAL, CONDYLOMA
Anesthesia: General

## 2022-06-05 MED ORDER — DEXAMETHASONE SODIUM PHOSPHATE 10 MG/ML IJ SOLN
INTRAMUSCULAR | Status: DC | PRN
  Administered 2022-06-05: 5 mg via INTRAVENOUS

## 2022-06-05 MED ORDER — BUPIVACAINE LIPOSOME 1.3 % IJ SUSP
INTRAMUSCULAR | Status: AC
  Filled 2022-06-05: qty 20

## 2022-06-05 MED ORDER — PROPOFOL 10 MG/ML IV BOLUS
INTRAVENOUS | Status: DC | PRN
  Administered 2022-06-05: 200 mg via INTRAVENOUS

## 2022-06-05 MED ORDER — SUGAMMADEX SODIUM 200 MG/2ML IV SOLN
INTRAVENOUS | Status: DC | PRN
  Administered 2022-06-05: 200 mg via INTRAVENOUS

## 2022-06-05 MED ORDER — ONDANSETRON HCL 4 MG/2ML IJ SOLN
INTRAMUSCULAR | Status: DC | PRN
  Administered 2022-06-05: 4 mg via INTRAVENOUS

## 2022-06-05 MED ORDER — FENTANYL CITRATE (PF) 100 MCG/2ML IJ SOLN
INTRAMUSCULAR | Status: AC
  Filled 2022-06-05: qty 2

## 2022-06-05 MED ORDER — MIDAZOLAM HCL 2 MG/2ML IJ SOLN
INTRAMUSCULAR | Status: AC
  Filled 2022-06-05: qty 2

## 2022-06-05 MED ORDER — BUPIVACAINE LIPOSOME 1.3 % IJ SUSP
INTRAMUSCULAR | Status: DC | PRN
  Administered 2022-06-05: 20 mL

## 2022-06-05 MED ORDER — OXYCODONE HCL 5 MG PO TABS: 5.0000 mg | ORAL_TABLET | Freq: Once | ORAL | Status: DC | PRN

## 2022-06-05 MED ORDER — SUCCINYLCHOLINE CHLORIDE 200 MG/10ML IV SOSY
PREFILLED_SYRINGE | INTRAVENOUS | Status: DC | PRN
  Administered 2022-06-05: 140 mg via INTRAVENOUS

## 2022-06-05 MED ORDER — ONDANSETRON HCL 4 MG/2ML IJ SOLN
INTRAMUSCULAR | Status: AC
  Filled 2022-06-05: qty 2

## 2022-06-05 MED ORDER — HYDROCODONE-ACETAMINOPHEN 5-325 MG PO TABS: 1.0000 | ORAL_TABLET | Freq: Four times a day (QID) | ORAL | 0 refills | Status: DC | PRN

## 2022-06-05 MED ORDER — ACETAMINOPHEN 500 MG PO TABS
ORAL_TABLET | ORAL | Status: AC
  Administered 2022-06-05: 1000 mg via ORAL
  Filled 2022-06-05: qty 2

## 2022-06-05 MED ORDER — DIBUCAINE (PERIANAL) 1 % EX OINT
TOPICAL_OINTMENT | CUTANEOUS | Status: AC
  Filled 2022-06-05: qty 28

## 2022-06-05 MED ORDER — MIDAZOLAM HCL 2 MG/2ML IJ SOLN
INTRAMUSCULAR | Status: DC | PRN
  Administered 2022-06-05: 2 mg via INTRAVENOUS

## 2022-06-05 MED ORDER — BUPIVACAINE-EPINEPHRINE 0.25% -1:200000 IJ SOLN
INTRAMUSCULAR | Status: DC | PRN
  Administered 2022-06-05: 30 mL

## 2022-06-05 MED ORDER — LIDOCAINE HCL (PF) 2 % IJ SOLN
INTRAMUSCULAR | Status: AC
  Filled 2022-06-05: qty 5

## 2022-06-05 MED ORDER — DIBUCAINE 1 % EX OINT
TOPICAL_OINTMENT | CUTANEOUS | Status: DC | PRN
  Administered 2022-06-05: 1

## 2022-06-05 MED ORDER — OXYCODONE HCL 5 MG/5ML PO SOLN: 5.0000 mg | Freq: Once | ORAL | Status: DC | PRN

## 2022-06-05 MED ORDER — PROPOFOL 10 MG/ML IV BOLUS
INTRAVENOUS | Status: AC
  Filled 2022-06-05: qty 20

## 2022-06-05 MED ORDER — KETAMINE HCL 50 MG/5ML IJ SOSY
PREFILLED_SYRINGE | INTRAMUSCULAR | Status: AC
  Filled 2022-06-05: qty 5

## 2022-06-05 MED ORDER — FENTANYL CITRATE (PF) 100 MCG/2ML IJ SOLN
INTRAMUSCULAR | Status: DC | PRN
Start: 2022-06-05 — End: 2022-06-05
  Administered 2022-06-05 (×2): 50 ug via INTRAVENOUS

## 2022-06-05 MED ORDER — CHLORHEXIDINE GLUCONATE 0.12 % MT SOLN
OROMUCOSAL | Status: AC
  Administered 2022-06-05: 15 mL via OROMUCOSAL
  Filled 2022-06-05: qty 15

## 2022-06-05 MED ORDER — LIDOCAINE HCL (CARDIAC) PF 100 MG/5ML IV SOSY
PREFILLED_SYRINGE | INTRAVENOUS | Status: DC | PRN
  Administered 2022-06-05: 100 mg via INTRAVENOUS

## 2022-06-05 MED ORDER — ROCURONIUM BROMIDE 100 MG/10ML IV SOLN
INTRAVENOUS | Status: DC | PRN
  Administered 2022-06-05: 20 mg via INTRAVENOUS

## 2022-06-05 MED ORDER — GELATIN ABSORBABLE 100 CM EX MISC
CUTANEOUS | Status: AC
Start: 2022-06-05 — End: ?
  Filled 2022-06-05: qty 1

## 2022-06-05 MED ORDER — BUPIVACAINE-EPINEPHRINE (PF) 0.25% -1:200000 IJ SOLN
INTRAMUSCULAR | Status: AC
  Filled 2022-06-05: qty 30

## 2022-06-05 MED ORDER — KETAMINE HCL 10 MG/ML IJ SOLN
INTRAMUSCULAR | Status: DC | PRN
  Administered 2022-06-05: 50 mg via INTRAVENOUS

## 2022-06-05 MED ORDER — GABAPENTIN 300 MG PO CAPS
ORAL_CAPSULE | ORAL | Status: AC
  Administered 2022-06-05: 300 mg via ORAL
  Filled 2022-06-05: qty 1

## 2022-06-05 MED ORDER — FENTANYL CITRATE (PF) 100 MCG/2ML IJ SOLN: 25.0000 ug | INTRAMUSCULAR | Status: DC | PRN

## 2022-06-05 SURGICAL SUPPLY — 31 items
ADAPTER SMOKE EVAC 7/8 TO 3/8 (ADAPTER) ×1 IMPLANT
BLADE SURG 15 STRL LF DISP TIS (BLADE) ×1 IMPLANT
BLADE SURG 15 STRL SS (BLADE) ×1
BRIEF MESH DISP 2XL (UNDERPADS AND DIAPERS) ×1 IMPLANT
DRAPE LAPAROTOMY 100X77 ABD (DRAPES) ×1 IMPLANT
DRAPE LEGGINS SURG 28X43 STRL (DRAPES) ×1 IMPLANT
DRAPE UNDER BUTTOCK W/FLU (DRAPES) ×1 IMPLANT
DRSG GAUZE FLUFF 36X18 (GAUZE/BANDAGES/DRESSINGS) ×1 IMPLANT
ELECT CAUTERY BLADE TIP 2.5 (TIP) ×1
ELECT REM PT RETURN 9FT ADLT (ELECTROSURGICAL) ×1
ELECTRODE CAUTERY BLDE TIP 2.5 (TIP) ×1 IMPLANT
ELECTRODE REM PT RTRN 9FT ADLT (ELECTROSURGICAL) ×1 IMPLANT
GAUZE 4X4 16PLY ~~LOC~~+RFID DBL (SPONGE) ×1 IMPLANT
GLOVE ORTHO TXT STRL SZ7.5 (GLOVE) ×1 IMPLANT
GOWN STRL REUS W/ TWL LRG LVL3 (GOWN DISPOSABLE) ×1 IMPLANT
GOWN STRL REUS W/ TWL XL LVL3 (GOWN DISPOSABLE) ×1 IMPLANT
GOWN STRL REUS W/TWL LRG LVL3 (GOWN DISPOSABLE) ×1
GOWN STRL REUS W/TWL XL LVL3 (GOWN DISPOSABLE) ×1
KIT TURNOVER KIT A (KITS) ×1 IMPLANT
MANIFOLD NEPTUNE II (INSTRUMENTS) ×1 IMPLANT
NDL SAFETY ECLIP 18X1.5 (MISCELLANEOUS) ×1 IMPLANT
NEEDLE HYPO 22GX1.5 SAFETY (NEEDLE) ×1 IMPLANT
NS IRRIG 500ML POUR BTL (IV SOLUTION) ×1 IMPLANT
PACK BASIN MINOR ARMC (MISCELLANEOUS) ×1 IMPLANT
SOL PREP PVP 2OZ (MISCELLANEOUS)
SOLUTION PREP PVP 2OZ (MISCELLANEOUS) ×1 IMPLANT
SURGILUBE 2OZ TUBE FLIPTOP (MISCELLANEOUS) ×1 IMPLANT
SYR 10ML LL (SYRINGE) ×1 IMPLANT
TRAP FLUID SMOKE EVACUATOR (MISCELLANEOUS) ×1 IMPLANT
TUBING SMOKE EVAC 6FT (TUBING) ×1 IMPLANT
WATER STERILE IRR 500ML POUR (IV SOLUTION) ×1 IMPLANT

## 2022-06-05 NOTE — Discharge Instructions (Signed)
AMBULATORY SURGERY  ?DISCHARGE INSTRUCTIONS ? ? ?The drugs that you were given will stay in your system until tomorrow so for the next 24 hours you should not: ? ?Drive an automobile ?Make any legal decisions ?Drink any alcoholic beverage ? ? ?You may resume regular meals tomorrow.  Today it is better to start with liquids and gradually work up to solid foods. ? ?You may eat anything you prefer, but it is better to start with liquids, then soup and crackers, and gradually work up to solid foods. ? ? ?Please notify your doctor immediately if you have any unusual bleeding, trouble breathing, redness and pain at the surgery site, drainage, fever, or pain not relieved by medication. ? ? ? ?Additional Instructions: ? ? ? ?Please contact your physician with any problems or Same Day Surgery at [phone], Monday through Friday 6 am to 4 pm, or [place] [facility] [place] Main number at [phone].  ?

## 2022-06-05 NOTE — Interval H&P Note (Signed)
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  The various methods of treatment have been discussed with the patient and family. After consideration of risks, benefits and other options for treatment, the patient has consented to  Procedure(s): CONDYLOMA REMOVAL, perianal (N/A) as a surgical intervention.  The patient's history has been reviewed, patient examined, no change in status, stable for surgery.  I have reviewed the patient's chart and labs.  Questions were answered to the patient's satisfaction.     Campbell Lerner

## 2022-06-05 NOTE — Anesthesia Procedure Notes (Signed)
Procedure Name: Intubation Date/Time: 06/05/2022 9:24 AM  Performed by: Berniece Pap, CRNAPre-anesthesia Checklist: Patient identified, Emergency Drugs available, Suction available and Patient being monitored Patient Re-evaluated:Patient Re-evaluated prior to induction Oxygen Delivery Method: Circle system utilized Preoxygenation: Pre-oxygenation with 100% oxygen Induction Type: IV induction Ventilation: Mask ventilation without difficulty Laryngoscope Size: McGraph and 4 Tube type: Oral Tube size: 7.5 mm Number of attempts: 1 Airway Equipment and Method: Stylet and Oral airway Placement Confirmation: ETT inserted through vocal cords under direct vision, positive ETCO2 and breath sounds checked- equal and bilateral Secured at: 22 cm Tube secured with: Tape Dental Injury: Teeth and Oropharynx as per pre-operative assessment

## 2022-06-05 NOTE — Transfer of Care (Signed)
Immediate Anesthesia Transfer of Care Note  Patient: Kevin Morris  Procedure(s) Performed: CONDYLOMA REMOVAL, perianal  Patient Location: PACU  Anesthesia Type:General  Level of Consciousness: awake and alert   Airway & Oxygen Therapy: Patient Spontanous Breathing and Patient connected to face mask oxygen  Post-op Assessment: Report given to RN and Post -op Vital signs reviewed and stable  Post vital signs: Reviewed and stable  Last Vitals:  Vitals Value Taken Time  BP 133/65 06/05/22 1025  Temp 36.3 C 06/05/22 1025  Pulse 91 06/05/22 1027  Resp 24 06/05/22 1027  SpO2 99 % 06/05/22 1027  Vitals shown include unvalidated device data.  Last Pain:  Vitals:   06/05/22 1025  TempSrc:   PainSc: Asleep         Complications: No notable events documented.

## 2022-06-05 NOTE — Op Note (Addendum)
Excision/destruction of perianal condyloma.  Anal rectal exam under anesthesia.  Pre-operative Diagnosis: Perianal condyloma  Post-operative Diagnosis: same.    Surgeon: Campbell Lerner, M.D., [facility]  Anesthesia: General endotracheal  Findings: As expected primary lesions bilaterally within gluteal cleft skin, with minimal involvement of the anterior posterior aspects.  Minimal true perianal lesions, no intra-anal lesions.  Estimated Blood Loss: 15 mL         Specimens: Perianal condyloma          Complications: none  Indications: Longstanding perianal lesions per patient's report, likely present for nearly 2 decades.  Denies any homosexual activity or anal intercourse.  Due to intolerance of anal exam in the office, an exam under anesthesia will be required to ensure there are no additional/occult lesions within the anal canal.             Procedure Details  The patient was seen again in the Holding Room. The benefits, complications, treatment options, and expected outcomes were discussed with the patient. The risks of bleeding, infection, recurrence of symptoms, failure to resolve symptoms, unanticipated injury, prosthetic placement, prosthetic infection, any of which could require further surgery were reviewed with the patient. The likelihood of improving the patient's symptoms with return to their baseline status is expected.  The patient and/or family concurred with the proposed plan, giving informed consent.  The patient was taken to Operating Room, identified and the procedure verified.    Prior to the induction of general anesthesia, antibiotic prophylaxis was administered. VTE prophylaxis was in place.  General anesthesia was then administered and tolerated well. After the induction, the patient was positioned in the prone position, carefully padded and positioned, and the buttocks were taped over benzoin coating to provide exposure.  The buttocks, natal cleft and perianal area was  prepped with Betadine and draped in the sterile fashion.  A Time Out was held and the above information confirmed. Lesions were readily identified, and utilizing quarter percent Marcaine with with epinephrine I infiltrated the dermis underlying all of the anal condyloma.  These were all then sharply excised leaving deep dermis intact.  Any areas where questionable margins were identified were scored with electrocautery to fulgurate any residual wart. There were 2 very small pinpoint areas on the patient's left posterior quadrant near the anus, otherwise there were no perianal or intra-anal lesions. An anal retractor was utilized to evaluate the distal rectum and anal canal, there was no visible lesions nor palpable lesions present. I confirmed adequate hemostasis.  Added local infiltration of Exparel to both sides.  Applied Nupercainal ointment and fluffs, secured with ABD. The patient was subsequently repositioned supine, mesh briefs placed to secure dressing and he was extubated and transferred to recovery room in stable condition. He tolerated procedure well.      Campbell Lerner M.D., [facility] 06/05/2022 10:28 AM

## 2022-06-05 NOTE — Anesthesia Preprocedure Evaluation (Signed)
Anesthesia Evaluation  Patient identified by MRN, date of birth, ID band Patient awake    Reviewed: Allergy & Precautions, NPO status , Patient's Chart, lab work & pertinent test results  History of Anesthesia Complications Negative for: history of anesthetic complications  Airway Mallampati: IV  TM Distance: >3 FB Neck ROM: full    Dental  (+) Dental Advidsory Given, Teeth Intact   Pulmonary neg shortness of breath, asthma , neg sleep apnea, neg COPD, neg recent URI, former smoker,    Pulmonary exam normal        Cardiovascular hypertension, (-) angina(-) Past MI and (-) CABG negative cardio ROS Normal cardiovascular exam     Neuro/Psych negative neurological ROS  negative psych ROS   GI/Hepatic negative GI ROS, Neg liver ROS,   Endo/Other  negative endocrine ROS  Renal/GU      Musculoskeletal   Abdominal   Peds  Hematology negative hematology ROS (+)   Anesthesia Other Findings Past Medical History: 11/06/2021: Apnea No date: CHF (congestive heart failure) (HCC) No date: Generalized headaches No date: GERD (gastroesophageal reflux disease) No date: Hypertension  Past Surgical History: No date: ACHILLES TENDON SURGERY; Right No date: KNEE ARTHROSCOPY; Bilateral No date: ORIF FINGER / THUMB FRACTURE; Bilateral No date: PILONIDAL CYST EXCISION No date: SHOULDER ARTHROSCOPY WITH LABRAL REPAIR; Right  BMI    Body Mass Index: 40.17 kg/m      Reproductive/Obstetrics negative OB ROS                             Anesthesia Physical Anesthesia Plan  ASA: 3  Anesthesia Plan: General ETT   Post-op Pain Management:    Induction: Intravenous  PONV Risk Score and Plan: 2 and Ondansetron, Dexamethasone, Midazolam and Treatment may vary due to age or medical condition  Airway Management Planned: Oral ETT  Additional Equipment:   Intra-op Plan:   Post-operative Plan: Extubation  in OR  Informed Consent: I have reviewed the patients History and Physical, chart, labs and discussed the procedure including the risks, benefits and alternatives for the proposed anesthesia with the patient or authorized representative who has indicated his/her understanding and acceptance.     Dental Advisory Given  Plan Discussed with: Anesthesiologist, CRNA and Surgeon  Anesthesia Plan Comments: (Patient consented for risks of anesthesia including but not limited to:  - adverse reactions to medications - damage to eyes, teeth, lips or other oral mucosa - nerve damage due to positioning  - sore throat or hoarseness - Damage to heart, brain, nerves, lungs, other parts of body or loss of life  Patient voiced understanding.)        Anesthesia Quick Evaluation

## 2022-06-06 LAB — SURGICAL PATHOLOGY

## 2022-06-06 NOTE — Anesthesia Postprocedure Evaluation (Signed)
Anesthesia Post Note  Patient: Kevin Morris  Procedure(s) Performed: CONDYLOMA REMOVAL, perianal  Patient location during evaluation: PACU Anesthesia Type: General Level of consciousness: awake and alert Pain management: pain level controlled Vital Signs Assessment: post-procedure vital signs reviewed and stable Respiratory status: spontaneous breathing, nonlabored ventilation, respiratory function stable and patient connected to nasal cannula oxygen Cardiovascular status: blood pressure returned to baseline and stable Postop Assessment: no apparent nausea or vomiting Anesthetic complications: no   No notable events documented.   Last Vitals:  Vitals:   06/05/22 1100 06/05/22 1128  BP: (!) 159/97 (!) 163/69  Pulse: 71 69  Resp: 16 15  Temp: (!) 36.4 C 36.5 C  SpO2: 95% 94%    Last Pain:  Vitals:   06/05/22 1128  TempSrc: Temporal  PainSc: 0-No pain                 Stephanie Coup

## 2022-06-20 ENCOUNTER — Ambulatory Visit (INDEPENDENT_AMBULATORY_CARE_PROVIDER_SITE_OTHER): Payer: Managed Care, Other (non HMO) | Admitting: Physician Assistant

## 2022-06-20 ENCOUNTER — Encounter: Payer: Self-pay | Admitting: Physician Assistant

## 2022-06-20 VITALS — BP 139/78 | HR 71 | Temp 98.0°F | Ht 76.0 in | Wt 330.0 lb

## 2022-06-20 DIAGNOSIS — A630 Anogenital (venereal) warts: Secondary | ICD-10-CM

## 2022-06-20 DIAGNOSIS — Z09 Encounter for follow-up examination after completed treatment for conditions other than malignant neoplasm: Secondary | ICD-10-CM | POA: Diagnosis not present

## 2022-06-20 MED ORDER — HYDROCORTISONE (PERIANAL) 2.5 % EX CREA: 1.0000 | TOPICAL_CREAM | Freq: Two times a day (BID) | CUTANEOUS | 1 refills | Status: DC | PRN

## 2022-06-20 NOTE — Progress Notes (Signed)
[facility] POST-OP OFFICE VISIT  06/20/2022  HPI: Kevin Morris is a 41 y.o. male 15 days s/p EUA and excisional of perianal condyloma with Dr Claudine Mouton   Doing well Painful for the first week but then got better quickly No issues with bowel function; fever; chills Some serous drainage from the area; minimal  Vital signs: BP 139/78   Pulse 71   Temp 98 F (36.7 C)   Ht 6\' 4"  (1.93 m)   Wt (!) 330 lb (149.7 kg)   SpO2 98%   BMI 40.17 kg/m    Physical Exam: Constitutional: Well appearing male, NAD GU: Caryl-lyn present as chaperone; excision surface is healing well; awaiting epithelization, no drainage appreciable   Assessment/Plan: This is a 41 y.o. male 15 days s/p EUA and excisional of perianal condyloma with Dr 41    - Pain control prn  - Reviewed surgical pathology; Condyloma  - He can follow up on as needed basis; He understands to call with questions/concerns  -- Claudine Mouton, PA-C [facility] 06/20/2022, 2:17 PM M-F: 7am - 4pm

## 2022-06-20 NOTE — Patient Instructions (Signed)
We will refill your Hydrocortisone cream for you.   Follow-up with our office as needed.  Please call and ask to speak with a nurse if you develop questions or concerns.   Continue with keeping the area clean, especially after any bowel movements.

## 2022-06-24 ENCOUNTER — Other Ambulatory Visit: Payer: Self-pay | Admitting: Family

## 2022-06-24 MED ORDER — DAPAGLIFLOZIN PROPANEDIOL 10 MG PO TABS: 10.0000 mg | ORAL_TABLET | Freq: Every day | ORAL | 3 refills | Status: DC

## 2022-06-24 NOTE — Progress Notes (Signed)
Refilled farxiga

## 2022-07-10 ENCOUNTER — Ambulatory Visit: Payer: Managed Care, Other (non HMO) | Admitting: Family

## 2022-08-19 ENCOUNTER — Ambulatory Visit: Payer: Managed Care, Other (non HMO) | Admitting: Internal Medicine

## 2022-08-19 NOTE — Progress Notes (Deleted)
Established Patient Office Visit  Subjective:  Patient ID: Kevin Morris, male    DOB: August 03, 1981  Age: 41 y.o. MRN: [id number]  CC:  No chief complaint on file.   HPI Kevin Morris presents for follow up on chronic medical conditions.   Hypertension: -Now following with HTN clinic -Medications: Amlodipine-HCTZ-Olmesartan 08-01-39 mg, Coreg 6.25 BID -Patient is compliant with above medications and reports no side effects. -Checking BP at home (average): is checking at home - 130-140/80-90 -Denies any chest pain, SOB, vision changes, LE edema or symptoms of hypotension, headaches improving -Diet: decreasing sodium in the diet, working on lifestyle changes -Exercise: trying to start working out again with weights, had been a Contractor in the past  HFpEF: -Last echo 08/30/21 - EF 55-60% without wall motion abnormalities or valvular pathology and LVH -Following up with Cardiology, note reviewed from 06/24/22.  -Patient is weighing daily, monitoring sodium and fluid intake   HLD: -Medications: Nothing -Last lipid panel: Lipid Panel     Component Value Date/Time   CHOL 191 03/19/2022 0854   TRIG 183 (H) 03/19/2022 0854   HDL 42 03/19/2022 0854   CHOLHDL 4.5 03/19/2022 0854   [place] 117 (H) 03/19/2022 0854   LABVLDL 32 03/19/2022 0854   The 10-year ASCVD risk score (Arnett DK, et al., 2019) is: 13.6%   Values used to calculate the score:     Age: 74 years     Sex: Male     Is Non-Hispanic African American: Yes     Diabetic: No     Tobacco smoker: Yes     Systolic Blood Pressure: 102 mmHg     Is BP treated: Yes     HDL Cholesterol: 42 mg/dL     Total Cholesterol: 191 mg/dL     GERD: -Currently on Pantoprazole 40 mg which has resolved his symptoms    Headaches: -Decreasing in frequency with better controlled BP -Sleep study ordered but not covered by insurance   Dental Infection: -At his [place] in March, he was seen for dental pain and found  to have an infection. He was prescribed Augmentin which he finished and did well with. Today he states he is still having the pain and the infection has returned, as he did not have time to go to a dentist and have the tooth pulled. He denies any other tooth pain, swelling or fevers.   Past Medical History:  Diagnosis Date   Apnea 11/06/2021   CHF (congestive heart failure) (HCC)    Generalized headaches    GERD (gastroesophageal reflux disease)    Hypertension     Past Surgical History:  Procedure Laterality Date   ACHILLES TENDON SURGERY Right    CONDYLOMA EXCISION/FULGURATION N/A 06/05/2022   Procedure: CONDYLOMA REMOVAL, perianal;  Surgeon: Ronny Bacon, MD;  Location: [facility] ORS;  Service: General;  Laterality: N/A;   KNEE ARTHROSCOPY Bilateral    ORIF FINGER / THUMB FRACTURE Bilateral    PILONIDAL CYST EXCISION     SHOULDER ARTHROSCOPY WITH LABRAL REPAIR Right     Family History  Problem Relation Age of Onset   Hypertension Mother    Cancer Maternal Grandmother        breast   Diabetes Maternal Grandmother    Stroke Maternal Grandmother     Social History   Socioeconomic History   Marital status: Single    Spouse name: Not on file   Number of children: Not on file   Years of education: Not on  file   Highest education level: Not on file  Occupational History   Not on file  Tobacco Use   Smoking status: Former    Types: Cigars    Passive exposure: Past   Smokeless tobacco: Not on file   Tobacco comments:    Very occasional cigar use  Vaping Use   Vaping Use: Never used  Substance and Sexual Activity   Alcohol use: Yes    Comment: occassional   Drug use: Never   Sexual activity: Yes  Other Topics Concern   Not on file  Social History Narrative   Not on file   Social Determinants of Health   Financial Resource Strain: Not on file  Food Insecurity: No Food Insecurity (11/06/2021)   Hunger Vital Sign    Worried About Running Out of Food in the Last  Year: Never true    Ran Out of Food in the Last Year: Never true  Transportation Needs: No Transportation Needs (11/06/2021)   PRAPARE - Hydrologist (Medical): No    Lack of Transportation (Non-Medical): No  Physical Activity: Sufficiently Active (11/06/2021)   Exercise Vital Sign    Days of Exercise per Week: 4 days    Minutes of Exercise per Session: 130 min  Stress: Not on file  Social Connections: Not on file  Intimate Partner Violence: Not on file    Outpatient Medications Prior to Visit  Medication Sig Dispense Refill   albuterol (VENTOLIN HFA) 108 (90 Base) MCG/ACT inhaler INHALE 2 PUFFS INTO THE LUNGS EVERY 6 HOURS AS NEEDED FOR WHEEZING OR SHORTNESS OF BREATH 24 g 0   carvedilol (COREG) 6.25 MG tablet Take 1 tablet (6.25 mg total) by mouth 2 (two) times daily. 180 tablet 3   dapagliflozin propanediol (FARXIGA) 10 MG TABS tablet Take 1 tablet (10 mg total) by mouth daily before breakfast. 90 tablet 3   HYDROcodone-acetaminophen (NORCO/VICODIN) 5-325 MG tablet Take 1 tablet by mouth every 6 (six) hours as needed for moderate pain. 15 tablet 0   hydrocortisone (ANUSOL-HC) 2.5 % rectal cream Apply 1 Application topically 2 (two) times daily as needed for hemorrhoids or anal itching (Apply directly to area of rectal irritation and rectal tear). 30 g 1   naproxen (NAPROSYN) 500 MG tablet Take 1 tablet (500 mg total) by mouth 2 (two) times daily with a meal. 30 tablet 0   Olmesartan-amLODIPine-HCTZ 40-10-25 MG TABS Take 1 tablet by mouth daily. 90 tablet 3   pantoprazole (PROTONIX) 40 MG tablet Take 1 tablet (40 mg total) by mouth daily. 90 tablet 3   tiZANidine (ZANAFLEX) 4 MG tablet Take 1 tablet (4 mg total) by mouth every 6 (six) hours as needed for muscle spasms. 30 tablet 0   No facility-administered medications prior to visit.    Allergies  Allergen Reactions   Iodinated Contrast Media Anaphylaxis   Shellfish-Derived Products Anaphylaxis     ROS Review of Systems  Constitutional:  Negative for chills, diaphoresis and fever.  HENT:  Positive for dental problem.   Eyes:  Negative for visual disturbance.  Respiratory:  Negative for shortness of breath.   Cardiovascular:  Negative for chest pain, palpitations and leg swelling.  Gastrointestinal:  Negative for abdominal pain, nausea and vomiting.  Neurological:  Negative for dizziness and headaches.      Objective:    Physical Exam Constitutional:      Appearance: Normal appearance.  HENT:     Head: Normocephalic and atraumatic.  Mouth/Throat:     Comments: Left front molar cracked with surrounding gingivitis, swollen with erythema  Eyes:     Conjunctiva/sclera: Conjunctivae normal.  Cardiovascular:     Rate and Rhythm: Normal rate and regular rhythm.  Pulmonary:     Effort: Pulmonary effort is normal.     Breath sounds: Normal breath sounds.  Musculoskeletal:     Right lower leg: No edema.     Left lower leg: No edema.  Skin:    General: Skin is warm and dry.  Neurological:     General: No focal deficit present.     Mental Status: He is alert. Mental status is at baseline.  Psychiatric:        Mood and Affect: Mood normal.        Behavior: Behavior normal.     There were no vitals taken for this visit. Wt Readings from Last 3 Encounters:  06/20/22 (!) 330 lb (149.7 kg)  06/05/22 (!) 330 lb (149.7 kg)  06/04/22 (!) 333 lb (151 kg)   There were no vitals filed for this visit.    Health Maintenance Due  Topic Date Due   COVID-19 Vaccine (1) Never done   Hepatitis C Screening  Never done   INFLUENZA VACCINE  05/07/2022    There are no preventive care reminders to display for this patient.  Lab Results  Component Value Date   TSH 2.219 08/29/2021   Lab Results  Component Value Date   WBC 7.2 06/04/2022   HGB 16.1 06/04/2022   HCT 46.3 06/04/2022   MCV 89.7 06/04/2022   PLT 335 06/04/2022   Lab Results  Component Value Date   NA  139 06/04/2022   K 3.8 06/04/2022   CO2 27 06/04/2022   GLUCOSE 96 06/04/2022   BUN 18 06/04/2022   CREATININE 1.20 06/04/2022   BILITOT 1.2 06/04/2022   ALKPHOS 51 06/04/2022   AST 40 06/04/2022   ALT 53 (H) 06/04/2022   PROT 8.5 (H) 06/04/2022   ALBUMIN 4.7 06/04/2022   CALCIUM 10.0 06/04/2022   ANIONGAP 10 06/04/2022   EGFR 76 03/19/2022   Lab Results  Component Value Date   CHOL 191 03/19/2022   Lab Results  Component Value Date   HDL 42 03/19/2022   Lab Results  Component Value Date   LDLCALC 117 (H) 03/19/2022   Lab Results  Component Value Date   TRIG 183 (H) 03/19/2022   Lab Results  Component Value Date   CHOLHDL 4.5 03/19/2022   Lab Results  Component Value Date   HGBA1C 5.5 08/29/2021      Assessment & Plan:   1. Resistant hypertension: Blood pressure stable today, at goal. Following with the HTN clinic and Cardiology. Currently on Olmesartan-Amlodipine-HCTZ 40-10-25 mg, Coreg 6.25 BID. He will continue these medications and continue to check his BP at home.   2. Chronic diastolic heart failure ([place]): Stable, following with Cardiology, note reviewed from 01/31/22. He is euvolemic today. He continues to monitor weights daily, sodium and fluid intake. He has gotten back on track with his diet and is exercising more.   3. Gastroesophageal reflux disease, unspecified whether esophagitis present: Stable, stop daily Pantoprazole, can continue taking it on a as needed basis.   4. Tooth decay: Infection still present, will retreat with Augmentin BID x 7 days. He needs to see a dentist soon to have the tooth pulled or it will get worse or spread. Patient understands and is agreeable.   -  amoxicillin-clavulanate (AUGMENTIN) 875-125 MG tablet; Take 1 tablet by mouth 2 (two) times daily for 7 days.  Dispense: 14 tablet; Refill: 0  5. Night muscle spasms: Muscle relaxers refilled.   - tiZANidine (ZANAFLEX) 4 MG tablet; Take 1 tablet (4 mg total) by mouth every 6  (six) hours as needed for muscle spasms.  Dispense: 30 tablet; Refill: 0   Follow-up: No follow-ups on file.    Teodora Medici, DO

## 2022-09-17 NOTE — Progress Notes (Incomplete)
Advanced Hypertension Clinic Follow-up:   Date:  09/17/2022   ID:  Kevin Morris, DOB [birth year], MRN [id number]  PCP:  Margarita Mail, DO  Cardiologist:  None  Nephrologist:  Referring MD: Margarita Mail, DO   CC: Hypertension  History of Present Illness:    Kevin Morris is a 41 y.o. male with a hx of chronic diastolic heart failure, hypertension, obesity, and OSA, here for follow-up. He was initially seen 11/06/21 in the Advanced Hypertension Clinic. He was admitted 08/2021 with acute on chronic diastolic heart failure. Echo on admission showed LVEF 55-60% and moderate LVH. He had asymmetric hypertrophy in the inferolateral regions. Diastolic function was indeterminate. He had renal artery dopplers that were negative. He was diuresed with IV lasix and amlodipine was added to his regimen. They increased hydralazine and gave him labetalol as needed. AM cortisol was low at 5.9, renin and aldosterone levels were normal. They recommended an outpatient sleep study.  He reported at home BP readings ranging in the 160s-180s/70s-80s. He also complained of slight chest pains for 1-2 weeks prior while lying down at night. Also at night he had snoring, occasional PND and night sweats. He had previously been unable to complete a sleep study as it would not be covered by his insurance. We attempted to assist him with receiving approval for a sleep study. Amlodipine, hydralazine, HCTZ, and lisinopril were stopped. He was switched to Tribenzor (amlodipine/HCTZ/olmesartan 08/01/39 mg daily). He was enrolled in [employer] remote patient monitoring. Per [employer] his blood pressures were running in the 120s-140s/70s-80s. He followed up with our pharmacist 01/2022; she noted that he was told by another provider to add lisinopril. She had him discontinue this and started carvedilol.   At his last appointment,  Today, he states that things are going well. His breathing has been okay, although sometimes he  hears his breathing is louder than normal and he may feel short winded. He completes weight lifting exercises and 3-4 days of cardio in a week (walking, treadmill, elliptical). For the most part he feels well while exercising; however, he has noticed he isn't exercising to the same capacity as he did previously. Generally he continues to follow a healthy diet and tries to monitor his salt intake. Mostly he is feeling well rested in the morning, unless he has a restless night. He confirms snoring. A few years ago he had issues with PND and waking himself up from snoring. In the past year he reports improvement with no significant PND episodes. He reports that his insurance would not cover a home sleep study. Due to prohibitive costs, he has not been taking [place]. Otherwise he remains compliant with his medications. He confirms taking 81 mg aspirin for preventative measures. He denies any palpitations, chest pain, or peripheral edema. No lightheadedness, headaches, or syncope.  Today,  He denies any palpitations, chest pain, shortness of breath, or peripheral edema. No lightheadedness, headaches, syncope, orthopnea, or PND.  (+)  Past Medical History:  Diagnosis Date   Apnea 11/06/2021   CHF (congestive heart failure) (HCC)    Generalized headaches    GERD (gastroesophageal reflux disease)    Hypertension     Past Surgical History:  Procedure Laterality Date   ACHILLES TENDON SURGERY Right    CONDYLOMA EXCISION/FULGURATION N/A 06/05/2022   Procedure: CONDYLOMA REMOVAL, perianal;  Surgeon: Campbell Lerner, MD;  Location: [facility] ORS;  Service: General;  Laterality: N/A;   KNEE ARTHROSCOPY Bilateral    ORIF FINGER / THUMB FRACTURE Bilateral  PILONIDAL CYST EXCISION     SHOULDER ARTHROSCOPY WITH LABRAL REPAIR Right     Current Medications: No outpatient medications have been marked as taking for the 09/18/22 encounter (Appointment) with Chilton Si, MD.     Allergies:   Iodinated  contrast media and Shellfish-derived products   Social History   Socioeconomic History   Marital status: Single    Spouse name: Not on file   Number of children: Not on file   Years of education: Not on file   Highest education level: Not on file  Occupational History   Not on file  Tobacco Use   Smoking status: Former    Types: Cigars    Passive exposure: Past   Smokeless tobacco: Not on file   Tobacco comments:    Very occasional cigar use  Vaping Use   Vaping Use: Never used  Substance and Sexual Activity   Alcohol use: Yes    Comment: occassional   Drug use: Never   Sexual activity: Yes  Other Topics Concern   Not on file  Social History Narrative   Not on file   Social Determinants of Health   Financial Resource Strain: Not on file  Food Insecurity: No Food Insecurity (11/06/2021)   Hunger Vital Sign    Worried About Running Out of Food in the Last Year: Never true    Ran Out of Food in the Last Year: Never true  Transportation Needs: No Transportation Needs (11/06/2021)   PRAPARE - Administrator, Civil Service (Medical): No    Lack of Transportation (Non-Medical): No  Physical Activity: Sufficiently Active (11/06/2021)   Exercise Vital Sign    Days of Exercise per Week: 4 days    Minutes of Exercise per Session: 130 min  Stress: Not on file  Social Connections: Not on file     Family History: The patient's family history includes Cancer in his maternal grandmother; Diabetes in his maternal grandmother; Hypertension in his mother; Stroke in his maternal grandmother.  ROS:   Please see the history of present illness.     All other systems reviewed and are negative.  EKGs/Labs/Other Studies Reviewed:    Renal Artery Dopplers 08/31/2021: IMPRESSION: Negative for renal artery stenosis by duplex criteria.   No acute renal abnormality.  Echo 08/30/2021: Sonographer Comments: Suboptimal apical window and no subcostal window.  IMPRESSIONS     1. Left ventricular ejection fraction, by estimation, is 55 to 60%. The  left ventricle has normal function. The left ventricle has no regional  wall motion abnormalities. There is moderate asymmetric left ventricular  hypertrophy of the mid to distal  inferior and lateral segments. Left ventricular diastolic parameters are  indeterminate.   2. Right ventricular systolic function is normal. The right ventricular  size is normal.   3. Left atrial size was moderately dilated.   4. The mitral valve is normal in structure. Mild mitral valve  regurgitation. No evidence of mitral stenosis.   5. The aortic valve was not well visualized. Aortic valve regurgitation  is not visualized. Aortic valve sclerosis is present, with no evidence of  aortic valve stenosis.   6. The inferior vena cava is normal in size with greater than 50%  respiratory variability, suggesting right atrial pressure of 3 mmHg.   EKG:  EKG is personally reviewed. 09/18/2022: *** 03/19/2022: EKG was not ordered. 11/06/2021: EKG was not ordered.  Recent Labs: 06/04/2022: ALT 53; BUN 18; Creatinine, Ser 1.20; Hemoglobin 16.1; Platelets 335;  Potassium 3.8; Sodium 139   Recent Lipid Panel    Component Value Date/Time   CHOL 191 03/19/2022 0854   TRIG 183 (H) 03/19/2022 0854   HDL 42 03/19/2022 0854   CHOLHDL 4.5 03/19/2022 0854   LDLCALC 117 (H) 03/19/2022 0854    Physical Exam:    VS:  There were no vitals taken for this visit. , BMI There is no height or weight on file to calculate BMI. GENERAL:  Well appearing HEENT: Pupils equal round and reactive, fundi not visualized, oral mucosa unremarkable NECK:  No jugular venous distention, waveform within normal limits, carotid upstroke brisk and symmetric, no bruits, no thyromegaly LUNGS:  Clear to auscultation bilaterally HEART:  RRR.  PMI not displaced or sustained,S1 and S2 within normal limits, no S3, no S4, no clicks, no rubs, no murmurs ABD:  Flat, positive bowel  sounds normal in frequency in pitch, no bruits, no rebound, no guarding, no midline pulsatile mass, no hepatomegaly, no splenomegaly EXT:  2 plus pulses throughout, no edema, no cyanosis no clubbing SKIN:  No rashes no nodules NEURO:  Cranial nerves II through XII grossly intact, motor grossly intact throughout PSYCH:  Cognitively intact, oriented to person place and time   ASSESSMENT/PLAN:    No problem-specific Assessment & Plan notes found for this encounter.     Screening for Secondary Hypertension:     11/06/2021    3:03 PM  Causes  Drugs/Herbals Screened     - Comments limiting sodium, no caffeine, rare EtOH  Sleep Apnea Screened     - Comments snores and has apnea.  Will get sleep study/    Relevant Labs/Studies:    Latest Ref Rng & Units 06/04/2022    1:13 PM 03/19/2022    8:54 AM 01/08/2022    8:40 AM  Basic Labs  Sodium 135 - 145 mmol/L 139  137  135   Potassium 3.5 - 5.1 mmol/L 3.8  4.5  4.2   Creatinine 0.61 - 1.24 mg/dL 9.56  2.13  0.86        Latest Ref Rng & Units 08/29/2021    8:20 PM  Thyroid   TSH 0.350 - 4.500 uIU/mL 2.219        Latest Ref Rng & Units 08/30/2021    7:41 AM  Renin/Aldosterone   Aldosterone 0.0 - 30.0 ng/dL 1.1      Disposition:    FU with Tiffany C. Duke Salvia, MD, [facility] in ***6 months.  Medication Adjustments/Labs and Tests Ordered: Current medicines are reviewed at length with the patient today.  Concerns regarding medicines are outlined above.   No orders of the defined types were placed in this encounter.  No orders of the defined types were placed in this encounter.  I,Mathew Stumpf,acting as a Neurosurgeon for Chilton Si, MD.,have documented all relevant documentation on the behalf of Chilton Si, MD,as directed by  Chilton Si, MD while in the presence of Chilton Si, MD.  I, Tiffany C. Duke Salvia, MD have reviewed all documentation for this visit.  The documentation of the exam, diagnosis, procedures, and  orders on 09/17/2022 are all accurate and complete.  Guadalupe Maple  09/17/2022 3:37 PM    [facility]

## 2022-09-18 ENCOUNTER — Ambulatory Visit (HOSPITAL_BASED_OUTPATIENT_CLINIC_OR_DEPARTMENT_OTHER): Payer: Managed Care, Other (non HMO) | Admitting: Cardiovascular Disease

## 2022-09-22 ENCOUNTER — Telehealth: Payer: Self-pay | Admitting: Internal Medicine

## 2022-09-22 DIAGNOSIS — K219 Gastro-esophageal reflux disease without esophagitis: Secondary | ICD-10-CM

## 2022-09-23 NOTE — Telephone Encounter (Signed)
Unable to refill per protocol, request is too soon. Last refill 10/19/21 for 90 and 3 refills. Will refuse.  Requested Prescriptions  Pending Prescriptions Disp Refills   pantoprazole (PROTONIX) 40 MG tablet [Pharmacy Med Name: PANTOPRAZOLE 40MG  TABLETS] 90 tablet 3    Sig: TAKE 1 TABLET(40 MG) BY MOUTH DAILY     Gastroenterology: Proton Pump Inhibitors Passed - 09/22/2022  6:40 AM      Passed - Valid encounter within last 12 months    Recent Outpatient Visits           7 months ago Tooth decay   [facility], DO   8 months ago Tooth ache   [facility], DO   10 months ago Primary hypertension   [facility], DO   11 months ago Primary hypertension   [facility], DO   1 year ago Primary hypertension   [facility], Margarita Mail

## 2022-10-03 ENCOUNTER — Other Ambulatory Visit (HOSPITAL_BASED_OUTPATIENT_CLINIC_OR_DEPARTMENT_OTHER): Payer: Self-pay | Admitting: Cardiovascular Disease

## 2023-05-28 NOTE — Progress Notes (Unsigned)
Established Patient Office Visit  Subjective:  Patient ID: Kevin Morris, male    DOB: Jun 29, 1981  Age: 42 y.o. MRN: [id number]  CC:  No chief complaint on file.   HPI Kevin Morris presents for BP follow up.   Hypertension: -Now following with HTN clinic -Medications: Amlodipine-HCTZ-Olmesartan 08-01-39, Coreg 6.25 BID -Patient is compliant with above medications and reports no side effects. -Checking BP at home (average): is checking at home - 130-140/80-90 -Denies any chest pain, SOB, vision changes, LE edema or symptoms of hypotension, headaches improving -Diet: decreasing sodium in the diet, working on lifestyle changes -Exercise: trying to start working out again with weights, had been a Clinical cytogeneticist in the past  HFpEF: -Last echo 08/30/21 - EF 55-60% without wall motion abnormalities or valvular pathology and LVH -Following up with Cardiology, recently added Farxiga 10 mg and Coreg.  -Patient is weighing daily, monitoring sodium and fluid intake  -Had been complaining of chest pain at last visit -    GERD: -Currently on Pantoprazole 40 mg which has resolved his symptoms    Headaches: -Decreasing in frequency with better controlled BP -Sleep study ordered but not covered by insurance   Dental Infection: -At his LOV in March, he was seen for dental pain and found to have an infection. He was prescribed Augmentin which he finished and did well with. Today he states he is still having the pain and the infection has returned, as he did not have time to go to a dentist and have the tooth pulled. He denies any other tooth pain, swelling or fevers.   Past Medical History:  Diagnosis Date   Apnea 11/06/2021   CHF (congestive heart failure) (HCC)    Generalized headaches    GERD (gastroesophageal reflux disease)    Hypertension     Past Surgical History:  Procedure Laterality Date   ACHILLES TENDON SURGERY Right    CONDYLOMA EXCISION/FULGURATION  N/A 06/05/2022   Procedure: CONDYLOMA REMOVAL, perianal;  Surgeon: Campbell Lerner, MD;  Location: [facility] ORS;  Service: General;  Laterality: N/A;   KNEE ARTHROSCOPY Bilateral    ORIF FINGER / THUMB FRACTURE Bilateral    PILONIDAL CYST EXCISION     SHOULDER ARTHROSCOPY WITH LABRAL REPAIR Right     Family History  Problem Relation Age of Onset   Hypertension Mother    Cancer Maternal Grandmother        breast   Diabetes Maternal Grandmother    Stroke Maternal Grandmother     Social History   Socioeconomic History   Marital status: Single    Spouse name: Not on file   Number of children: Not on file   Years of education: Not on file   Highest education level: Not on file  Occupational History   Not on file  Tobacco Use   Smoking status: Former    Types: Cigars    Passive exposure: Past   Smokeless tobacco: Not on file   Tobacco comments:    Very occasional cigar use  Vaping Use   Vaping status: Never Used  Substance and Sexual Activity   Alcohol use: Yes    Comment: occassional   Drug use: Never   Sexual activity: Yes  Other Topics Concern   Not on file  Social History Narrative   Not on file   Social Determinants of Health   Financial Resource Strain: Not on file  Food Insecurity: No Food Insecurity (11/06/2021)   Hunger Vital Sign    Worried  About Running Out of Food in the Last Year: Never true    Ran Out of Food in the Last Year: Never true  Transportation Needs: No Transportation Needs (11/06/2021)   PRAPARE - Administrator, Civil Service (Medical): No    Lack of Transportation (Non-Medical): No  Physical Activity: Sufficiently Active (11/06/2021)   Exercise Vital Sign    Days of Exercise per Week: 4 days    Minutes of Exercise per Session: 130 min  Stress: Not on file  Social Connections: Not on file  Intimate Partner Violence: Not on file    Outpatient Medications Prior to Visit  Medication Sig Dispense Refill   albuterol (VENTOLIN HFA)  108 (90 Base) MCG/ACT inhaler INHALE 2 PUFFS INTO THE LUNGS EVERY 6 HOURS AS NEEDED FOR WHEEZING OR SHORTNESS OF BREATH 24 g 0   carvedilol (COREG) 6.25 MG tablet Take 1 tablet (6.25 mg total) by mouth 2 (two) times daily. 180 tablet 3   dapagliflozin propanediol (FARXIGA) 10 MG TABS tablet Take 1 tablet (10 mg total) by mouth daily before breakfast. 90 tablet 3   HYDROcodone-acetaminophen (NORCO/VICODIN) 5-325 MG tablet Take 1 tablet by mouth every 6 (six) hours as needed for moderate pain. 15 tablet 0   hydrocortisone (ANUSOL-HC) 2.5 % rectal cream Apply 1 Application topically 2 (two) times daily as needed for hemorrhoids or anal itching (Apply directly to area of rectal irritation and rectal tear). 30 g 1   naproxen (NAPROSYN) 500 MG tablet Take 1 tablet (500 mg total) by mouth 2 (two) times daily with a meal. 30 tablet 0   Olmesartan-amLODIPine-HCTZ 40-10-25 MG TABS TAKE 1 TABLET BY MOUTH DAILY 90 tablet 3   pantoprazole (PROTONIX) 40 MG tablet Take 1 tablet (40 mg total) by mouth daily. 90 tablet 3   tiZANidine (ZANAFLEX) 4 MG tablet Take 1 tablet (4 mg total) by mouth every 6 (six) hours as needed for muscle spasms. 30 tablet 0   No facility-administered medications prior to visit.    Allergies  Allergen Reactions   Iodinated Contrast Media Anaphylaxis   Shellfish-Derived Products Anaphylaxis    ROS Review of Systems  Constitutional:  Negative for chills, diaphoresis and fever.  HENT:  Positive for dental problem.   Eyes:  Negative for visual disturbance.  Respiratory:  Negative for shortness of breath.   Cardiovascular:  Negative for chest pain, palpitations and leg swelling.  Gastrointestinal:  Negative for abdominal pain, nausea and vomiting.  Neurological:  Negative for dizziness and headaches.      Objective:    Physical Exam Constitutional:      Appearance: Normal appearance.  HENT:     Head: Normocephalic and atraumatic.     Mouth/Throat:     Comments: Left front  molar cracked with surrounding gingivitis, swollen with erythema  Eyes:     Conjunctiva/sclera: Conjunctivae normal.  Cardiovascular:     Rate and Rhythm: Normal rate and regular rhythm.  Pulmonary:     Effort: Pulmonary effort is normal.     Breath sounds: Normal breath sounds.  Musculoskeletal:     Right lower leg: No edema.     Left lower leg: No edema.  Skin:    General: Skin is warm and dry.  Neurological:     General: No focal deficit present.     Mental Status: He is alert. Mental status is at baseline.  Psychiatric:        Mood and Affect: Mood normal.  Behavior: Behavior normal.     There were no vitals taken for this visit. Wt Readings from Last 3 Encounters:  06/20/22 (!) 330 lb (149.7 kg)  06/05/22 (!) 330 lb (149.7 kg)  06/04/22 (!) 333 lb (151 kg)   There were no vitals filed for this visit.    Health Maintenance Due  Topic Date Due   Hepatitis C Screening  Never done   COVID-19 Vaccine (1 - 2023-24 season) Never done   INFLUENZA VACCINE  05/08/2023    There are no preventive care reminders to display for this patient.  Lab Results  Component Value Date   TSH 2.219 08/29/2021   Lab Results  Component Value Date   WBC 7.2 06/04/2022   HGB 16.1 06/04/2022   HCT 46.3 06/04/2022   MCV 89.7 06/04/2022   PLT 335 06/04/2022   Lab Results  Component Value Date   NA 139 06/04/2022   K 3.8 06/04/2022   CO2 27 06/04/2022   GLUCOSE 96 06/04/2022   BUN 18 06/04/2022   CREATININE 1.20 06/04/2022   BILITOT 1.2 06/04/2022   ALKPHOS 51 06/04/2022   AST 40 06/04/2022   ALT 53 (H) 06/04/2022   PROT 8.5 (H) 06/04/2022   ALBUMIN 4.7 06/04/2022   CALCIUM 10.0 06/04/2022   ANIONGAP 10 06/04/2022   EGFR 76 03/19/2022   Lab Results  Component Value Date   CHOL 191 03/19/2022   Lab Results  Component Value Date   HDL 42 03/19/2022   Lab Results  Component Value Date   LDLCALC 117 (H) 03/19/2022   Lab Results  Component Value Date   TRIG  183 (H) 03/19/2022   Lab Results  Component Value Date   CHOLHDL 4.5 03/19/2022   Lab Results  Component Value Date   HGBA1C 5.5 08/29/2021      Assessment & Plan:   1. Resistant hypertension: Blood pressure stable today, at goal. Following with the HTN clinic and Cardiology. Currently on Olmesartan-Amlodipine-HCTZ 40-10-25 mg, Coreg 6.25 BID. He will continue these medications and continue to check his BP at home.   2. Chronic diastolic heart failure (HCC): Stable, following with Cardiology, note reviewed from 01/31/22. He is euvolemic today. He continues to monitor weights daily, sodium and fluid intake. He has gotten back on track with his diet and is exercising more.   3. Gastroesophageal reflux disease, unspecified whether esophagitis present: Stable, stop daily Pantoprazole, can continue taking it on a as needed basis.   4. Tooth decay: Infection still present, will retreat with Augmentin BID x 7 days. He needs to see a dentist soon to have the tooth pulled or it will get worse or spread. Patient understands and is agreeable.   - amoxicillin-clavulanate (AUGMENTIN) 875-125 MG tablet; Take 1 tablet by mouth 2 (two) times daily for 7 days.  Dispense: 14 tablet; Refill: 0  5. Night muscle spasms: Muscle relaxers refilled.   - tiZANidine (ZANAFLEX) 4 MG tablet; Take 1 tablet (4 mg total) by mouth every 6 (six) hours as needed for muscle spasms.  Dispense: 30 tablet; Refill: 0   Follow-up: No follow-ups on file.    Margarita Mail, DO

## 2023-05-29 ENCOUNTER — Other Ambulatory Visit (HOSPITAL_COMMUNITY)
Admission: RE | Admit: 2023-05-29 | Discharge: 2023-05-29 | Disposition: A | Discharge status: Home / Self Care | Payer: BC Managed Care – PPO | Source: Ambulatory Visit | Attending: Internal Medicine | Admitting: Internal Medicine

## 2023-05-29 ENCOUNTER — Ambulatory Visit: Payer: BC Managed Care – PPO | Admitting: Internal Medicine

## 2023-05-29 ENCOUNTER — Other Ambulatory Visit: Payer: Self-pay

## 2023-05-29 ENCOUNTER — Encounter: Payer: Self-pay | Admitting: Internal Medicine

## 2023-05-29 VITALS — BP 152/100 | HR 100 | Temp 98.1°F | Resp 18 | Ht 76.0 in | Wt 299.7 lb

## 2023-05-29 DIAGNOSIS — Z13220 Encounter for screening for lipoid disorders: Secondary | ICD-10-CM | POA: Diagnosis not present

## 2023-05-29 DIAGNOSIS — M62838 Other muscle spasm: Secondary | ICD-10-CM

## 2023-05-29 DIAGNOSIS — I5032 Chronic diastolic (congestive) heart failure: Secondary | ICD-10-CM

## 2023-05-29 DIAGNOSIS — Z113 Encounter for screening for infections with a predominantly sexual mode of transmission: Secondary | ICD-10-CM

## 2023-05-29 DIAGNOSIS — I1A0 Resistant hypertension: Secondary | ICD-10-CM

## 2023-05-29 DIAGNOSIS — K219 Gastro-esophageal reflux disease without esophagitis: Secondary | ICD-10-CM

## 2023-05-29 MED ORDER — CARVEDILOL 6.25 MG PO TABS: 6.2500 mg | ORAL_TABLET | Freq: Two times a day (BID) | ORAL | 3 refills | Status: DC

## 2023-05-29 MED ORDER — TIZANIDINE HCL 4 MG PO TABS: 4.0000 mg | ORAL_TABLET | Freq: Every evening | ORAL | 1 refills | Status: DC | PRN

## 2023-05-29 MED ORDER — OLMESARTAN MEDOXOMIL-HCTZ 40-12.5 MG PO TABS: 1.0000 | ORAL_TABLET | Freq: Every day | ORAL | 0 refills | Status: DC

## 2023-05-29 MED ORDER — PANTOPRAZOLE SODIUM 20 MG PO TBEC: 20.0000 mg | DELAYED_RELEASE_TABLET | Freq: Every day | ORAL | 3 refills | Status: DC

## 2023-05-29 MED ORDER — DAPAGLIFLOZIN PROPANEDIOL 10 MG PO TABS: 10.0000 mg | ORAL_TABLET | Freq: Every day | ORAL | 3 refills | Status: DC

## 2023-05-30 ENCOUNTER — Telehealth: Payer: Self-pay

## 2023-05-30 ENCOUNTER — Other Ambulatory Visit: Payer: Self-pay

## 2023-05-30 ENCOUNTER — Emergency Department
Admission: EM | Admit: 2023-05-30 | Discharge: 2023-05-31 | Disposition: A | Discharge status: Home / Self Care | Payer: BC Managed Care – PPO | Attending: Emergency Medicine | Admitting: Emergency Medicine

## 2023-05-30 ENCOUNTER — Encounter: Payer: Self-pay | Admitting: Emergency Medicine

## 2023-05-30 DIAGNOSIS — E1165 Type 2 diabetes mellitus with hyperglycemia: Secondary | ICD-10-CM | POA: Diagnosis not present

## 2023-05-30 DIAGNOSIS — R739 Hyperglycemia, unspecified: Secondary | ICD-10-CM | POA: Diagnosis not present

## 2023-05-30 LAB — BLOOD GAS, VENOUS
Acid-Base Excess: 3.4 mmol/L — ABNORMAL HIGH (ref 0.0–2.0)
Bicarbonate: 29.6 mmol/L — ABNORMAL HIGH (ref 20.0–28.0)
O2 Saturation: 72.8 %
Patient temperature: 37
pCO2, Ven: 50 mmHg (ref 44–60)
pH, Ven: 7.38 (ref 7.25–7.43)
pO2, Ven: 36 mmHg (ref 32–45)

## 2023-05-30 LAB — COMPREHENSIVE METABOLIC PANEL
ALT: 29 U/L (ref 0–44)
AST: 19 U/L (ref 15–41)
Albumin: 3.9 g/dL (ref 3.5–5.0)
Alkaline Phosphatase: 107 U/L (ref 38–126)
Anion gap: 11 (ref 5–15)
BUN: 13 mg/dL (ref 6–20)
CO2: 26 mmol/L (ref 22–32)
Calcium: 9.2 mg/dL (ref 8.9–10.3)
Chloride: 83 mmol/L — ABNORMAL LOW (ref 98–111)
Creatinine, Ser: 1.28 mg/dL — ABNORMAL HIGH (ref 0.61–1.24)
GFR, Estimated: >60 mL/min (ref >60)
Glucose, Bld: 827 mg/dL (ref 70–99)
Potassium: 4.4 mmol/L (ref 3.5–5.1)
Sodium: 120 mmol/L — ABNORMAL LOW (ref 135–145)
Total Bilirubin: 0.9 mg/dL (ref 0.3–1.2)
Total Protein: 7.4 g/dL (ref 6.5–8.1)

## 2023-05-30 LAB — CBC WITH DIFFERENTIAL/PLATELET
Abs Immature Granulocytes: 0.03 10*3/uL (ref 0.00–0.07)
Absolute Monocytes: 462 {cells}/uL (ref 200–950)
Basophils Absolute: 27 {cells}/uL (ref 0–200)
Basophils Absolute: 0 10*3/uL (ref 0.0–0.1)
Basophils Relative: 0.4 %
Basophils Relative: 0 %
Eosinophils Absolute: 20 {cells}/uL (ref 15–500)
Eosinophils Absolute: 0.1 10*3/uL (ref 0.0–0.5)
Eosinophils Relative: 0.3 %
Eosinophils Relative: 1 %
HCT: 46.1 % (ref 38.5–50.0)
HCT: 41 % (ref 39.0–52.0)
Hemoglobin: 15.7 g/dL (ref 13.2–17.1)
Hemoglobin: 14.8 g/dL (ref 13.0–17.0)
Immature Granulocytes: 0 %
Lymphocytes Relative: 31 %
Lymphs Abs: 2037 {cells}/uL (ref 850–3900)
Lymphs Abs: 2.2 10*3/uL (ref 0.7–4.0)
MCH: 30.3 pg (ref 27.0–33.0)
MCH: 30.1 pg (ref 26.0–34.0)
MCHC: 34.1 g/dL (ref 32.0–36.0)
MCHC: 36.1 g/dL — ABNORMAL HIGH (ref 30.0–36.0)
MCV: 88.8 fL (ref 80.0–100.0)
MCV: 83.5 fL (ref 80.0–100.0)
MPV: 11.5 fL (ref 7.5–12.5)
Monocytes Absolute: 0.6 10*3/uL (ref 0.1–1.0)
Monocytes Relative: 6.9 %
Monocytes Relative: 9 %
Neutro Abs: 4154 {cells}/uL (ref 1500–7800)
Neutro Abs: 4.3 10*3/uL (ref 1.7–7.7)
Neutrophils Relative %: 62 %
Neutrophils Relative %: 59 %
Platelets: 279 10*3/uL (ref 140–400)
Platelets: 282 10*3/uL (ref 150–400)
RBC: 5.19 10*6/uL (ref 4.20–5.80)
RBC: 4.91 MIL/uL (ref 4.22–5.81)
RDW: 11.7 % (ref 11.0–15.0)
RDW: 11.2 % — ABNORMAL LOW (ref 11.5–15.5)
Total Lymphocyte: 30.4 %
WBC: 6.7 10*3/uL (ref 3.8–10.8)
WBC: 7.2 10*3/uL (ref 4.0–10.5)
nRBC: 0 % (ref 0.0–0.2)

## 2023-05-30 LAB — URINALYSIS, ROUTINE W REFLEX MICROSCOPIC
Bacteria, UA: NONE SEEN
Bilirubin Urine: NEGATIVE
Glucose, UA: >=500 mg/dL — AB
Hgb urine dipstick: NEGATIVE
Ketones, ur: NEGATIVE mg/dL
Leukocytes,Ua: NEGATIVE
Nitrite: NEGATIVE
Protein, ur: NEGATIVE mg/dL
Specific Gravity, Urine: 1.026 (ref 1.005–1.030)
Squamous Epithelial / HPF: NONE SEEN /HPF (ref 0–5)
pH: 7 (ref 5.0–8.0)

## 2023-05-30 LAB — HIV ANTIBODY (ROUTINE TESTING W REFLEX): HIV 1&2 Ab, 4th Generation: NONREACTIVE

## 2023-05-30 LAB — LIPID PANEL
Cholesterol: 261 mg/dL — ABNORMAL HIGH (ref <200)
HDL: 40 mg/dL (ref >=40)
Non-HDL Cholesterol (Calc): 221 mg/dL — ABNORMAL HIGH (ref <130)
Total CHOL/HDL Ratio: 6.5 (calc) — ABNORMAL HIGH (ref <5.0)
Triglycerides: 413 mg/dL — ABNORMAL HIGH (ref <150)

## 2023-05-30 LAB — COMPLETE METABOLIC PANEL WITH GFR
AG Ratio: 1.5 (calc) (ref 1.0–2.5)
ALT: 27 U/L (ref 9–46)
AST: 18 U/L (ref 10–40)
Albumin: 4.5 g/dL (ref 3.6–5.1)
Alkaline phosphatase (APISO): 116 U/L (ref 36–130)
BUN/Creatinine Ratio: 8 (calc) (ref 6–22)
BUN: 11 mg/dL (ref 7–25)
CO2: 23 mmol/L (ref 20–32)
Calcium: 9.7 mg/dL (ref 8.6–10.3)
Chloride: 85 mmol/L — ABNORMAL LOW (ref 98–110)
Creat: 1.3 mg/dL — ABNORMAL HIGH (ref 0.60–1.29)
Globulin: 3.1 g/dL (ref 1.9–3.7)
Glucose, Bld: 731 mg/dL (ref 65–99)
Potassium: 4.5 mmol/L (ref 3.5–5.3)
Sodium: 121 mmol/L — ABNORMAL LOW (ref 135–146)
Total Bilirubin: 0.6 mg/dL (ref 0.2–1.2)
Total Protein: 7.6 g/dL (ref 6.1–8.1)
eGFR: 71 mL/min/{1.73_m2} (ref >=60)

## 2023-05-30 LAB — HEPATITIS C ANTIBODY: Hepatitis C Ab: NONREACTIVE

## 2023-05-30 LAB — BETA-HYDROXYBUTYRIC ACID: Beta-Hydroxybutyric Acid: 0.38 mmol/L — ABNORMAL HIGH (ref 0.05–0.27)

## 2023-05-30 LAB — URINE CYTOLOGY ANCILLARY ONLY
Chlamydia: NEGATIVE
Comment: NEGATIVE
Comment: NEGATIVE
Comment: NORMAL
Neisseria Gonorrhea: NEGATIVE
Trichomonas: NEGATIVE

## 2023-05-30 LAB — CBG MONITORING, ED
Glucose-Capillary: >600 mg/dL (ref 70–99)
Glucose-Capillary: 581 mg/dL (ref 70–99)

## 2023-05-30 LAB — RPR: RPR Ser Ql: NONREACTIVE

## 2023-05-30 MED ORDER — METFORMIN HCL 500 MG PO TABS
500.0000 mg | ORAL_TABLET | Freq: Once | ORAL | Status: AC
  Administered 2023-05-31: 500 mg via ORAL
  Filled 2023-05-30: qty 1

## 2023-05-30 MED ORDER — BLOOD GLUCOSE MONITORING SUPPL DEVI: 1.0000 | Freq: Three times a day (TID) | 0 refills | Status: DC

## 2023-05-30 MED ORDER — METFORMIN HCL 500 MG PO TABS: 500.0000 mg | ORAL_TABLET | Freq: Two times a day (BID) | ORAL | 0 refills | Status: DC

## 2023-05-30 MED ORDER — BLOOD GLUCOSE TEST VI STRP: 1.0000 | ORAL_STRIP | Freq: Three times a day (TID) | 0 refills | Status: DC

## 2023-05-30 MED ORDER — LANCETS MISC. MISC: 1.0000 | Freq: Three times a day (TID) | 0 refills | Status: DC

## 2023-05-30 MED ORDER — SODIUM CHLORIDE 0.9 % IV BOLUS
1000.0000 mL | Freq: Once | INTRAVENOUS | Status: AC
  Administered 2023-05-30: 1000 mL via INTRAVENOUS

## 2023-05-30 MED ORDER — LANCET DEVICE MISC: 1.0000 | Freq: Three times a day (TID) | 0 refills | Status: DC

## 2023-05-30 MED ORDER — INSULIN ASPART 100 UNIT/ML IJ SOLN
5.0000 [IU] | Freq: Once | INTRAMUSCULAR | Status: AC
  Administered 2023-05-31: 5 [IU] via INTRAVENOUS
  Filled 2023-05-30: qty 1

## 2023-05-30 MED ORDER — INSULIN ASPART 100 UNIT/ML IJ SOLN
10.0000 [IU] | Freq: Once | INTRAMUSCULAR | Status: AC
  Administered 2023-05-30: 10 [IU] via INTRAVENOUS
  Filled 2023-05-30: qty 1

## 2023-05-30 NOTE — ED Notes (Signed)
Pt taken to the room and attached to the monitor by EDT; primary RN made aware of the patients arrival.

## 2023-05-30 NOTE — Telephone Encounter (Signed)
Appt schd.

## 2023-05-30 NOTE — ED Triage Notes (Signed)
Pt presents ambulatory to triage via POV with complaints of hyperglycemia. Pt was diagnosed this AM with T2DM and is coming in for "insulin" or medication since his PCP hasn't prescribed anything. Has not checked his CBG at home but has had increased thirst and urination. A&Ox4 at this time. Denies CP or SOB.    CBG in triage reads "High".

## 2023-05-30 NOTE — ED Provider Notes (Signed)
[facility] Provider Note    Event Date/Time   First MD Initiated Contact with Patient 05/30/23 2030     (approximate)   History   Hyperglycemia   HPI  Kevin Morris is a 42 y.o. male who presents to the emergency department today because of concerns for high blood sugar.  The patient has noticed over the past couple of months that he has had increased urination.  He had initially attributed to dietary changes however followed up with his primary care doctor who checked blood work and found patient to be hyperglycemic.  Patient without any history of diabetes.     Physical Exam   Triage Vital Signs: ED Triage Vitals  Encounter Vitals Group     BP 05/30/23 2024 126/78     Systolic BP Percentile --      Diastolic BP Percentile --      Pulse Rate 05/30/23 2024 89     Resp 05/30/23 2024 18     Temp 05/30/23 2024 98.6 F (37 C)     Temp Source 05/30/23 2024 Oral     SpO2 05/30/23 2024 100 %     Weight 05/30/23 2020 299 lb 9.7 oz (135.9 kg)     Height 05/30/23 2020 6\' 4"  (1.93 m)     Head Circumference --      Peak Flow --      Pain Score 05/30/23 2019 0     Pain Loc --      Pain Education --      Exclude from Growth Chart --     Most recent vital signs: Vitals:   05/30/23 2024  BP: 126/78  Pulse: 89  Resp: 18  Temp: 98.6 F (37 C)  SpO2: 100%   General: Awake, alert, oriented. CV:  Good peripheral perfusion. Regular rate and rhythm. Resp:  Normal effort. Lungs clear. Abd:  No distention.    ED Results / Procedures / Treatments   Labs (all labs ordered are listed, but only abnormal results are displayed) Labs Reviewed  CBC WITH DIFFERENTIAL/PLATELET - Abnormal; Notable for the following components:      Result Value   MCHC 36.1 (*)    RDW 11.2 (*)    All other components within normal limits  COMPREHENSIVE METABOLIC PANEL - Abnormal; Notable for the following components:   Sodium 120 (*)    Chloride 83 (*)    Glucose, Bld  827 (*)    Creatinine, Ser 1.28 (*)    All other components within normal limits  BETA-HYDROXYBUTYRIC ACID - Abnormal; Notable for the following components:   Beta-Hydroxybutyric Acid 0.38 (*)    All other components within normal limits  URINALYSIS, ROUTINE W REFLEX MICROSCOPIC - Abnormal; Notable for the following components:   Color, Urine COLORLESS (*)    APPearance CLEAR (*)    Glucose, UA >=500 (*)    All other components within normal limits  BLOOD GAS, VENOUS - Abnormal; Notable for the following components:   Bicarbonate 29.6 (*)    Acid-Base Excess 3.4 (*)    All other components within normal limits  CBG MONITORING, ED - Abnormal; Notable for the following components:   Glucose-Capillary >600 (*)    All other components within normal limits     EKG  None   RADIOLOGY None   PROCEDURES:  Critical Care performed: No    MEDICATIONS ORDERED IN ED: Medications  insulin aspart (novoLOG) injection 10 Units (has no administration in time range)  sodium chloride 0.9 % bolus 1,000 mL (1,000 mLs Intravenous New Bag/Given 05/30/23 2113)  sodium chloride 0.9 % bolus 1,000 mL (1,000 mLs Intravenous New Bag/Given 05/30/23 2216)     IMPRESSION / MDM / ASSESSMENT AND PLAN / ED COURSE  I reviewed the triage vital signs and the nursing notes.                              Differential diagnosis includes, but is not limited to, hyperglycemia, DKA  Patient's presentation is most consistent with acute presentation with potential threat to life or bodily function.   Patient presented to the emergency department today because of concerns for hyperglycemia. Patient states he was newly diagnosed with diabetes but has not yet been prescribed any medication.  On exam patient is awake and alert.  Blood work is notable for significant hyperglycemia however no anion gap elevation or other findings concerning for DKA.  At this time will start IV fluids and give IV insulin to try to  bring down the patient's sugar.  I do think patient's sugars can be controlled would be reasonable for patient be discharged.       FINAL CLINICAL IMPRESSION(S) / ED DIAGNOSES   Final diagnoses:  Hyperglycemia     Note:  This document was prepared using Dragon voice recognition software and may include unintentional dictation errors.    Phineas Semen, MD 05/30/23 [phone]

## 2023-05-30 NOTE — Discharge Instructions (Addendum)
Please be sure to follow up with your provider for further management of your blood sugar.  Start taking metformin twice daily

## 2023-05-30 NOTE — Telephone Encounter (Signed)
Please schedule him for Monday at 9:20 for new onset DM

## 2023-05-31 LAB — CBG MONITORING, ED
Glucose-Capillary: 439 mg/dL — ABNORMAL HIGH (ref 70–99)
Glucose-Capillary: 488 mg/dL — ABNORMAL HIGH (ref 70–99)

## 2023-05-31 MED ORDER — METFORMIN HCL 500 MG PO TABS: 500.0000 mg | ORAL_TABLET | Freq: Two times a day (BID) | ORAL | 0 refills | Status: DC

## 2023-05-31 MED ORDER — BLOOD GLUCOSE MONITORING SUPPL DEVI: 1.0000 | Freq: Three times a day (TID) | 0 refills | Status: DC

## 2023-05-31 MED ORDER — LANCET DEVICE MISC: 1.0000 | Freq: Three times a day (TID) | 0 refills | Status: AC

## 2023-05-31 MED ORDER — BLOOD GLUCOSE TEST VI STRP: 1.0000 | ORAL_STRIP | Freq: Three times a day (TID) | 0 refills | Status: AC

## 2023-05-31 MED ORDER — LANCETS MISC. MISC: 1.0000 | Freq: Three times a day (TID) | 0 refills | Status: AC

## 2023-06-02 ENCOUNTER — Ambulatory Visit: Payer: BC Managed Care – PPO | Admitting: Internal Medicine

## 2023-06-02 ENCOUNTER — Other Ambulatory Visit: Payer: Self-pay | Admitting: Internal Medicine

## 2023-06-02 ENCOUNTER — Encounter: Payer: Self-pay | Admitting: Internal Medicine

## 2023-06-02 VITALS — BP 130/74 | HR 95 | Temp 98.0°F | Ht 76.0 in | Wt 298.3 lb

## 2023-06-02 DIAGNOSIS — Z7984 Long term (current) use of oral hypoglycemic drugs: Secondary | ICD-10-CM

## 2023-06-02 DIAGNOSIS — E1165 Type 2 diabetes mellitus with hyperglycemia: Secondary | ICD-10-CM

## 2023-06-02 DIAGNOSIS — I10 Essential (primary) hypertension: Secondary | ICD-10-CM

## 2023-06-02 LAB — POCT GLYCOSYLATED HEMOGLOBIN (HGB A1C): Hemoglobin A1C: 14 % — AB (ref 4.0–5.6)

## 2023-06-02 MED ORDER — TIRZEPATIDE 2.5 MG/0.5ML ~~LOC~~ SOAJ
2.5000 mg | SUBCUTANEOUS | 0 refills | Status: DC
Start: 2023-06-02 — End: 2023-06-04

## 2023-06-02 NOTE — Progress Notes (Signed)
Established Patient Office Visit  Subjective:  Patient ID: Kevin Morris, male    DOB: 1981/09/07  Age: 42 y.o. MRN: [id number]  CC:  Chief Complaint  Patient presents with   ER follow up    HPI [name] presents for follow up. Patient was seen here last week, was concerned about sugars. Blood sugar on labs was >700, he was sent to the ER. No signs of DKA, he was given an insulin drip and started on Metformin.   Diabetes, Type 2: -Last A1c never done -Medications: Farxiga 10 mg, started on Metformin 500 mg BID 2 days ago at the ER -Patient is compliant with the above medications and reports no side effects.  -Checking BG at home: not checking -Diet: drinking more soda lately -Exercise: trying to work out at [employer] but very fatigued -Eye exam: Will scheduled with eye doctor -Foot exam: Due today -Microalbumin: Due today -Statin: No -PNA vaccine: Will discuss at follow up -Denies symptoms of hypoglycemia, polyuria, polydipsia, but does have numbness and tingling in the bottoms of his feet.   Hypertension: -No longer following with HTN clinic -Medications: Had been on Amlodipine-HCTZ-Olmesartan 08-01-39, Coreg 6.25 BID but not on anything now -Patient is compliant with above medications and reports no side effects. -Checking BP at home (average): not checking -Denies any chest pain, SOB, vision changes, LE edema or symptoms of hypotension, headaches improving. Has noticed loss of muscle mass, urinating more frequently but no dysuria, hematuria. Would like to be STD screened today.  HFpEF: -Last echo 08/30/21 - EF 55-60% without wall motion abnormalities or valvular pathology and LVH -No longer following up with Cardiology   GERD: -Had been on Pantoprazole 40 mg which has resolved his symptoms but also stopped drinking so symptoms much less frequent     Past Medical History:  Diagnosis Date   Apnea 11/06/2021   CHF (congestive heart failure) (HCC)     Generalized headaches    GERD (gastroesophageal reflux disease)    Hypertension     Past Surgical History:  Procedure Laterality Date   ACHILLES TENDON SURGERY Right    CONDYLOMA EXCISION/FULGURATION N/A 06/05/2022   Procedure: CONDYLOMA REMOVAL, perianal;  Surgeon: Campbell Lerner, MD;  Location: [facility] ORS;  Service: General;  Laterality: N/A;   KNEE ARTHROSCOPY Bilateral    ORIF FINGER / THUMB FRACTURE Bilateral    PILONIDAL CYST EXCISION     SHOULDER ARTHROSCOPY WITH LABRAL REPAIR Right     Family History  Problem Relation Age of Onset   Hypertension Mother    Cancer Maternal Grandmother        breast   Diabetes Maternal Grandmother    Stroke Maternal Grandmother     Social History   Socioeconomic History   Marital status: Single    Spouse name: Not on file   Number of children: Not on file   Years of education: Not on file   Highest education level: Not on file  Occupational History   Not on file  Tobacco Use   Smoking status: Former    Types: Cigars    Passive exposure: Past   Smokeless tobacco: Not on file   Tobacco comments:    Very occasional cigar use  Vaping Use   Vaping status: Never Used  Substance and Sexual Activity   Alcohol use: Yes    Comment: occassional   Drug use: Never   Sexual activity: Yes  Other Topics Concern   Not on file  Social History Narrative  Not on file   Social Determinants of Health   Financial Resource Strain: Not on file  Food Insecurity: No Food Insecurity (11/06/2021)   Hunger Vital Sign    Worried About Running Out of Food in the Last Year: Never true    Ran Out of Food in the Last Year: Never true  Transportation Needs: No Transportation Needs (11/06/2021)   PRAPARE - Administrator, Civil Service (Medical): No    Lack of Transportation (Non-Medical): No  Physical Activity: Sufficiently Active (11/06/2021)   Exercise Vital Sign    Days of Exercise per Week: 4 days    Minutes of Exercise per Session:  130 min  Stress: Not on file  Social Connections: Not on file  Intimate Partner Violence: Not on file    Outpatient Medications Prior to Visit  Medication Sig Dispense Refill   Blood Glucose Monitoring Suppl DEVI 1 each by Does not apply route in the morning, at noon, and at bedtime. May substitute to any manufacturer covered by patient's insurance. 1 each 0   carvedilol (COREG) 6.25 MG tablet Take 1 tablet (6.25 mg total) by mouth 2 (two) times daily.     dapagliflozin propanediol (FARXIGA) 10 MG TABS tablet Take 1 tablet (10 mg total) by mouth daily before breakfast.     Glucose Blood (BLOOD GLUCOSE TEST STRIPS) STRP 1 each by In Vitro route in the morning, at noon, and at bedtime. May substitute to any manufacturer covered by patient's insurance. 100 strip 0   Lancet Device MISC 1 each by Does not apply route in the morning, at noon, and at bedtime. May substitute to any manufacturer covered by patient's insurance. 1 each 0   Lancets Misc. MISC 1 each by Does not apply route in the morning, at noon, and at bedtime. May substitute to any manufacturer covered by patient's insurance. 100 each 0   metFORMIN (GLUCOPHAGE) 500 MG tablet Take 1 tablet (500 mg total) by mouth 2 (two) times daily with a meal. 60 tablet 0   olmesartan-hydrochlorothiazide (BENICAR HCT) 40-12.5 MG tablet Take 1 tablet by mouth daily.     pantoprazole (PROTONIX) 20 MG tablet Take 1 tablet (20 mg total) by mouth daily.     tiZANidine (ZANAFLEX) 4 MG tablet Take 1 tablet (4 mg total) by mouth at bedtime as needed for muscle spasms.     albuterol (VENTOLIN HFA) 108 (90 Base) MCG/ACT inhaler INHALE 2 PUFFS INTO THE LUNGS EVERY 6 HOURS AS NEEDED FOR WHEEZING OR SHORTNESS OF BREATH (Patient not taking: Reported on 05/29/2023) 24 g 0   HYDROcodone-acetaminophen (NORCO/VICODIN) 5-325 MG tablet Take 1 tablet by mouth every 6 (six) hours as needed for moderate pain. (Patient not taking: Reported on 05/29/2023) 15 tablet 0    hydrocortisone (ANUSOL-HC) 2.5 % rectal cream Apply 1 Application topically 2 (two) times daily as needed for hemorrhoids or anal itching (Apply directly to area of rectal irritation and rectal tear). (Patient not taking: Reported on 05/29/2023) 30 g 1   naproxen (NAPROSYN) 500 MG tablet Take 1 tablet (500 mg total) by mouth 2 (two) times daily with a meal. (Patient not taking: Reported on 05/29/2023) 30 tablet 0   No facility-administered medications prior to visit.    Allergies  Allergen Reactions   Iodinated Contrast Media Anaphylaxis   Shellfish-Derived Products Anaphylaxis    ROS Review of Systems  Constitutional:  Negative for chills and fever.  Eyes:  Negative for visual disturbance.  Respiratory:  Negative for  shortness of breath.   Cardiovascular:  Negative for chest pain, palpitations and leg swelling.  Genitourinary:  Positive for frequency and urgency. Negative for dysuria, flank pain and hematuria.  Neurological:  Positive for numbness. Negative for dizziness and headaches.      Objective:    Physical Exam Constitutional:      Appearance: Normal appearance.  HENT:     Head: Normocephalic and atraumatic.  Eyes:     Conjunctiva/sclera: Conjunctivae normal.  Cardiovascular:     Rate and Rhythm: Normal rate and regular rhythm.     Pulses:          Dorsalis pedis pulses are 2+ on the right side and 2+ on the left side.  Pulmonary:     Effort: Pulmonary effort is normal.     Breath sounds: Normal breath sounds.  Musculoskeletal:     Right foot: Normal range of motion. No deformity, bunion, Charcot foot, foot drop or prominent metatarsal heads.     Left foot: Normal range of motion. No deformity, bunion, Charcot foot, foot drop or prominent metatarsal heads.  Feet:     Right foot:     Protective Sensation: 6 sites tested.  6 sites sensed.     Skin integrity: Skin integrity normal.     Toenail Condition: Right toenails are normal.     Left foot:     Protective  Sensation: 6 sites tested.  6 sites sensed.     Skin integrity: Skin integrity normal.     Toenail Condition: Left toenails are normal.  Skin:    General: Skin is warm and dry.  Neurological:     General: No focal deficit present.     Mental Status: He is alert. Mental status is at baseline.  Psychiatric:        Mood and Affect: Mood normal.        Behavior: Behavior normal.     BP 130/74   Pulse 95   Temp 98 F (36.7 C)   Ht 6\' 4"  (1.93 m)   Wt 298 lb 4.8 oz (135.3 kg)   SpO2 95%   BMI 36.31 kg/m  Wt Readings from Last 3 Encounters:  06/02/23 298 lb 4.8 oz (135.3 kg)  05/30/23 299 lb 9.7 oz (135.9 kg)  05/29/23 299 lb 11.2 oz (135.9 kg)   Vitals:   06/02/23 0913  BP: 130/74      Health Maintenance Due  Topic Date Due   INFLUENZA VACCINE  05/08/2023    There are no preventive care reminders to display for this patient.  Lab Results  Component Value Date   TSH 2.219 08/29/2021   Lab Results  Component Value Date   WBC 7.2 05/30/2023   HGB 14.8 05/30/2023   HCT 41.0 05/30/2023   MCV 83.5 05/30/2023   PLT 282 05/30/2023   Lab Results  Component Value Date   NA 120 (L) 05/30/2023   K 4.4 05/30/2023   CO2 26 05/30/2023   GLUCOSE 827 (HH) 05/30/2023   BUN 13 05/30/2023   CREATININE 1.28 (H) 05/30/2023   BILITOT 0.9 05/30/2023   ALKPHOS 107 05/30/2023   AST 19 05/30/2023   ALT 29 05/30/2023   PROT 7.4 05/30/2023   ALBUMIN 3.9 05/30/2023   CALCIUM 9.2 05/30/2023   ANIONGAP 11 05/30/2023   EGFR 71 05/29/2023   Lab Results  Component Value Date   CHOL 261 (H) 05/29/2023   Lab Results  Component Value Date   HDL 40 05/29/2023  Lab Results  Component Value Date   [facility]  05/29/2023     Comment:     . LDL cholesterol not calculated. Triglyceride levels greater than 400 mg/dL invalidate calculated LDL results. . Reference range: <100 . Desirable range <100 mg/dL for primary prevention;   <70 mg/dL for patients with CHD or diabetic  patients  with > or = 2 CHD risk factors. Marland Kitchen LDL-C is now calculated using the Martin-Hopkins  calculation, which is a validated novel method providing  better accuracy than the Friedewald equation in the  estimation of LDL-C.  Horald Pollen et al. Lenox Ahr. 4098;119(14): 2061-2068  (http://education.[employer].com/faq/FAQ164)    Lab Results  Component Value Date   TRIG 413 (H) 05/29/2023   Lab Results  Component Value Date   CHOLHDL 6.5 (H) 05/29/2023   Lab Results  Component Value Date   HGBA1C 5.5 08/29/2021      Assessment & Plan:   1. Type 2 diabetes mellitus with hyperglycemia, without long-term current use of insulin (HCC): A1c >14.0 here today. Patient on Farxiga 10 mg and just started on Metformin 500 mg BID, will continue and add Mounjaro 2.5 mg weekly. Discussed nutrition and diet with the patient as well. Urine test and foot exam today. Recheck in 2 weeks, after that patient is moving to [place] so will refill all medications.   - POCT HgB A1C - tirzepatide (MOUNJARO) 2.5 MG/0.5ML Pen; Inject 2.5 mg into the skin once a week.  Dispense: 2 mL; Refill: 0 - Urine Microalbumin w/creat. ratio - HM Diabetes Foot Exam  2. Primary hypertension: Much better, continue Olmesartan-hydrochlorothiazide 40-12.5 mg daily.    Follow-up: Return for already scheduled.    Margarita Mail, DO

## 2023-06-02 NOTE — Patient Instructions (Signed)

## 2023-06-03 LAB — MICROALBUMIN / CREATININE URINE RATIO
Creatinine, Urine: 32 mg/dL (ref 20–320)
Microalb, Ur: <0.2 mg/dL

## 2023-06-03 NOTE — Telephone Encounter (Signed)
Requested medication (s) are due for refill today: routing for review  Requested medication (s) are on the active medication list: yes  Last refill:  06/02/23  Future visit scheduled: yes  Notes to clinic:  Pharmacy comment: Product Backordered/Unavailable:PRESCRIBED PRODUCT NOT IN STOCK. PLEASE CONSIDER THE COST-EFFECTIVE POTENTIAL ALTERNATIVE(S) LISTED AND EVALUATE IF APPROPRIATE FOR YOUR PATIENT'S INDICATION AND TREATMENT GOALS      Requested Prescriptions  Pending Prescriptions Disp Refills   OZEMPIC, 0.25 OR 0.5 MG/DOSE, 2 MG/3ML SOPN [Pharmacy Med Name: OZEMPIC 0.25-0.5 MG/DOSE PEN]  0    Sig: INJECT 0.25MG  ONCE WEEKLY BY SUBCUTANEOUS ROUTE FOR 4 WKS, THEN INCREASE TO 0.5MG  THEREAFTER     Endocrinology:  Diabetes - GLP-1 Receptor Agonists - semaglutide Failed - 06/02/2023 10:56 AM      Failed - HBA1C in normal range and within 180 days    Hemoglobin A1C  Date Value Ref Range Status  06/02/2023 14.0 (A) 4.0 - 5.6 % Final   Hgb A1c MFr Bld  Date Value Ref Range Status  08/29/2021 5.5 4.8 - 5.6 % Final    Comment:    (NOTE) Pre diabetes:          5.7%-6.4%  Diabetes:              >6.4%  Glycemic control for   <7.0% adults with diabetes          Failed - Cr in normal range and within 360 days    Creat  Date Value Ref Range Status  05/29/2023 1.30 (H) 0.60 - 1.29 mg/dL Final   Creatinine, Ser  Date Value Ref Range Status  05/30/2023 1.28 (H) 0.61 - 1.24 mg/dL Final   Creatinine, Urine  Date Value Ref Range Status  06/02/2023 32 20 - 320 mg/dL Final         Passed - Valid encounter within last 6 months    Recent Outpatient Visits           Yesterday Type 2 diabetes mellitus with hyperglycemia, without long-term current use of insulin [facility])   [facility] Margarita Mail, DO   5 days ago Resistant hypertension   [facility] Margarita Mail, DO   1 year ago Tooth decay   [facility] Margarita Mail, DO   1 year ago Tooth ache   [facility] Caro Laroche, DO   1 year ago Primary hypertension   [facility] Margarita Mail, DO       Future Appointments             In 1 week Margarita Mail, DO [facility]

## 2023-06-11 NOTE — Progress Notes (Unsigned)
Established Patient Office Visit  Subjective:  Patient ID: Kevin Morris, male    DOB: 06/23/1981  Age: 42 y.o. MRN: [id number]  CC:  No chief complaint on file.   HPI Kevin Morris presents for follow up. Patient was seen here last week, was concerned about sugars. Blood sugar on labs was >700, he was sent to the ER. No signs of DKA, he was given an insulin drip and started on Metformin.   Diabetes, Type 2: -Last A1c never done -Medications: Farxiga 10 mg, Metformin 500 mg BID, started on Ozempic 0.25 mg at LOV 2 weeks ago -Patient is compliant with the above medications and reports no side effects.  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   Hypertension: -No longer following with HTN clinic -Medications: Had been on Amlodipine-HCTZ-Olmesartan 08-01-39, Coreg 6.25 BID but not on anything now -Patient is compliant with above medications and reports no side effects. -Checking BP at home (average): not checking -Denies any chest pain, SOB, vision changes, LE edema or symptoms of hypotension, headaches improving. Has noticed loss of muscle mass, urinating more frequently but no dysuria, hematuria. Would like to be STD screened today.  HFpEF: -Last echo 08/30/21 - EF 55-60% without wall motion abnormalities or valvular pathology and LVH -No longer following up with Cardiology   GERD: -Had been on Pantoprazole 40 mg which has resolved his symptoms but also stopped drinking so symptoms much less frequent     Past Medical History:  Diagnosis Date   Apnea 11/06/2021   CHF (congestive heart failure) (HCC)    Generalized headaches     GERD (gastroesophageal reflux disease)    Hypertension     Past Surgical History:  Procedure Laterality Date   ACHILLES TENDON SURGERY Right    CONDYLOMA EXCISION/FULGURATION N/A 06/05/2022   Procedure: CONDYLOMA REMOVAL, perianal;  Surgeon: Campbell Lerner, MD;  Location: [facility] ORS;  Service: General;  Laterality: N/A;   KNEE ARTHROSCOPY Bilateral    ORIF FINGER / THUMB FRACTURE Bilateral    PILONIDAL CYST EXCISION     SHOULDER ARTHROSCOPY WITH LABRAL REPAIR Right     Family History  Problem Relation Age of Onset   Hypertension Mother    Cancer Maternal Grandmother        breast   Diabetes Maternal Grandmother    Stroke Maternal Grandmother     Social History   Socioeconomic History   Marital status: Single    Spouse name: Not on file   Number of children: Not on file   Years of education: Not on file   Highest education level: Not on file  Occupational History   Not on file  Tobacco Use   Smoking status: Former    Types: Cigars    Passive exposure: Past   Smokeless tobacco: Not on file   Tobacco comments:    Very occasional cigar use  Vaping Use   Vaping status: Never Used  Substance and Sexual Activity   Alcohol use: Yes    Comment: occassional   Drug use: Never   Sexual activity: Yes  Other Topics Concern   Not on file  Social History Narrative   Not on file  Social Determinants of Health   Financial Resource Strain: Not on file  Food Insecurity: No Food Insecurity (11/06/2021)   Hunger Vital Sign    Worried About Running Out of Food in the Last Year: Never true    Ran Out of Food in the Last Year: Never true  Transportation Needs: No Transportation Needs (11/06/2021)   PRAPARE - Administrator, Civil Service (Medical): No    Lack of Transportation (Non-Medical): No  Physical Activity: Sufficiently Active (11/06/2021)   Exercise Vital Sign    Days of Exercise per Week: 4 days    Minutes of Exercise per Session: 130 min  Stress: Not on  file  Social Connections: Not on file  Intimate Partner Violence: Not on file    Outpatient Medications Prior to Visit  Medication Sig Dispense Refill   albuterol (VENTOLIN HFA) 108 (90 Base) MCG/ACT inhaler INHALE 2 PUFFS INTO THE LUNGS EVERY 6 HOURS AS NEEDED FOR WHEEZING OR SHORTNESS OF BREATH (Patient not taking: Reported on 05/29/2023) 24 g 0   Blood Glucose Monitoring Suppl DEVI 1 each by Does not apply route in the morning, at noon, and at bedtime. May substitute to any manufacturer covered by patient's insurance. 1 each 0   carvedilol (COREG) 6.25 MG tablet Take 1 tablet (6.25 mg total) by mouth 2 (two) times daily.     dapagliflozin propanediol (FARXIGA) 10 MG TABS tablet Take 1 tablet (10 mg total) by mouth daily before breakfast.     Glucose Blood (BLOOD GLUCOSE TEST STRIPS) STRP 1 each by In Vitro route in the morning, at noon, and at bedtime. May substitute to any manufacturer covered by patient's insurance. 100 strip 0   HYDROcodone-acetaminophen (NORCO/VICODIN) 5-325 MG tablet Take 1 tablet by mouth every 6 (six) hours as needed for moderate pain. (Patient not taking: Reported on 05/29/2023) 15 tablet 0   hydrocortisone (ANUSOL-HC) 2.5 % rectal cream Apply 1 Application topically 2 (two) times daily as needed for hemorrhoids or anal itching (Apply directly to area of rectal irritation and rectal tear). (Patient not taking: Reported on 05/29/2023) 30 g 1   Lancet Device MISC 1 each by Does not apply route in the morning, at noon, and at bedtime. May substitute to any manufacturer covered by patient's insurance. 1 each 0   Lancets Misc. MISC 1 each by Does not apply route in the morning, at noon, and at bedtime. May substitute to any manufacturer covered by patient's insurance. 100 each 0   metFORMIN (GLUCOPHAGE) 500 MG tablet Take 1 tablet (500 mg total) by mouth 2 (two) times daily with a meal. 60 tablet 0   naproxen (NAPROSYN) 500 MG tablet Take 1 tablet (500 mg total) by mouth 2 (two)  times daily with a meal. (Patient not taking: Reported on 05/29/2023) 30 tablet 0   olmesartan-hydrochlorothiazide (BENICAR HCT) 40-12.5 MG tablet Take 1 tablet by mouth daily.     pantoprazole (PROTONIX) 20 MG tablet Take 1 tablet (20 mg total) by mouth daily.     Semaglutide,0.25 or 0.5MG /DOS, (OZEMPIC, 0.25 OR 0.5 MG/DOSE,) 2 MG/3ML SOPN INJECT 0.25MG  ONCE WEEKLY BY SUBCUTANEOUS ROUTE FOR 4 WKS, THEN INCREASE TO 0.5MG  THEREAFTER 2 mL 0   tiZANidine (ZANAFLEX) 4 MG tablet Take 1 tablet (4 mg total) by mouth at bedtime as needed for muscle spasms.     No facility-administered medications prior to visit.    Allergies  Allergen Reactions   Iodinated Contrast Media Anaphylaxis   Shellfish-Derived Products Anaphylaxis  ROS Review of Systems  Constitutional:  Negative for chills and fever.  Eyes:  Negative for visual disturbance.  Respiratory:  Negative for shortness of breath.   Cardiovascular:  Negative for chest pain, palpitations and leg swelling.  Genitourinary:  Positive for frequency and urgency. Negative for dysuria, flank pain and hematuria.  Neurological:  Positive for numbness. Negative for dizziness and headaches.      Objective:    Physical Exam Constitutional:      Appearance: Normal appearance.  HENT:     Head: Normocephalic and atraumatic.  Eyes:     Conjunctiva/sclera: Conjunctivae normal.  Cardiovascular:     Rate and Rhythm: Normal rate and regular rhythm.     Pulses:          Dorsalis pedis pulses are 2+ on the right side and 2+ on the left side.  Pulmonary:     Effort: Pulmonary effort is normal.     Breath sounds: Normal breath sounds.  Musculoskeletal:     Right foot: Normal range of motion. No deformity, bunion, Charcot foot, foot drop or prominent metatarsal heads.     Left foot: Normal range of motion. No deformity, bunion, Charcot foot, foot drop or prominent metatarsal heads.  Feet:     Right foot:     Protective Sensation: 6 sites tested.  6  sites sensed.     Skin integrity: Skin integrity normal.     Toenail Condition: Right toenails are normal.     Left foot:     Protective Sensation: 6 sites tested.  6 sites sensed.     Skin integrity: Skin integrity normal.     Toenail Condition: Left toenails are normal.  Skin:    General: Skin is warm and dry.  Neurological:     General: No focal deficit present.     Mental Status: He is alert. Mental status is at baseline.  Psychiatric:        Mood and Affect: Mood normal.        Behavior: Behavior normal.     There were no vitals taken for this visit. Wt Readings from Last 3 Encounters:  06/02/23 298 lb 4.8 oz (135.3 kg)  05/30/23 299 lb 9.7 oz (135.9 kg)  05/29/23 299 lb 11.2 oz (135.9 kg)   There were no vitals filed for this visit.     Health Maintenance Due  Topic Date Due   INFLUENZA VACCINE  05/08/2023   COVID-19 Vaccine (1 - 2023-24 season) Never done    There are no preventive care reminders to display for this patient.  Lab Results  Component Value Date   TSH 2.219 08/29/2021   Lab Results  Component Value Date   WBC 7.2 05/30/2023   HGB 14.8 05/30/2023   HCT 41.0 05/30/2023   MCV 83.5 05/30/2023   PLT 282 05/30/2023   Lab Results  Component Value Date   NA 120 (L) 05/30/2023   K 4.4 05/30/2023   CO2 26 05/30/2023   GLUCOSE 827 (HH) 05/30/2023   BUN 13 05/30/2023   CREATININE 1.28 (H) 05/30/2023   BILITOT 0.9 05/30/2023   ALKPHOS 107 05/30/2023   AST 19 05/30/2023   ALT 29 05/30/2023   PROT 7.4 05/30/2023   ALBUMIN 3.9 05/30/2023   CALCIUM 9.2 05/30/2023   ANIONGAP 11 05/30/2023   EGFR 71 05/29/2023   Lab Results  Component Value Date   CHOL 261 (H) 05/29/2023   Lab Results  Component Value Date   HDL 40  05/29/2023   Lab Results  Component Value Date   [facility]  05/29/2023     Comment:     . LDL cholesterol not calculated. Triglyceride levels greater than 400 mg/dL invalidate calculated LDL results. . Reference range:  <100 . Desirable range <100 mg/dL for primary prevention;   <70 mg/dL for patients with CHD or diabetic patients  with > or = 2 CHD risk factors. Marland Kitchen LDL-C is now calculated using the Martin-Hopkins  calculation, which is a validated novel method providing  better accuracy than the Friedewald equation in the  estimation of LDL-C.  Horald Pollen et al. Lenox Ahr. 7829;562(13): 2061-2068  (http://education.[employer].com/faq/FAQ164)    Lab Results  Component Value Date   TRIG 413 (H) 05/29/2023   Lab Results  Component Value Date   CHOLHDL 6.5 (H) 05/29/2023   Lab Results  Component Value Date   HGBA1C 14.0 (A) 06/02/2023      Assessment & Plan:   1. Type 2 diabetes mellitus with hyperglycemia, without long-term current use of insulin (HCC): A1c >14.0 here today. Patient on Farxiga 10 mg and just started on Metformin 500 mg BID, will continue and add Mounjaro 2.5 mg weekly. Discussed nutrition and diet with the patient as well. Urine test and foot exam today. Recheck in 2 weeks, after that patient is moving to [place] so will refill all medications.   - POCT HgB A1C - tirzepatide (MOUNJARO) 2.5 MG/0.5ML Pen; Inject 2.5 mg into the skin once a week.  Dispense: 2 mL; Refill: 0 - Urine Microalbumin w/creat. ratio - HM Diabetes Foot Exam  2. Primary hypertension: Much better, continue Olmesartan-hydrochlorothiazide 40-12.5 mg daily.    Follow-up: No follow-ups on file.    Margarita Mail, DO

## 2023-06-12 ENCOUNTER — Ambulatory Visit: Payer: BC Managed Care – PPO | Admitting: Internal Medicine

## 2023-06-12 ENCOUNTER — Encounter: Payer: Self-pay | Admitting: Internal Medicine

## 2023-06-12 VITALS — BP 122/78 | HR 78 | Temp 97.8°F | Ht 76.0 in | Wt 294.8 lb

## 2023-06-12 DIAGNOSIS — K219 Gastro-esophageal reflux disease without esophagitis: Secondary | ICD-10-CM

## 2023-06-12 DIAGNOSIS — I1A0 Resistant hypertension: Secondary | ICD-10-CM

## 2023-06-12 DIAGNOSIS — E1165 Type 2 diabetes mellitus with hyperglycemia: Secondary | ICD-10-CM

## 2023-06-12 DIAGNOSIS — I5032 Chronic diastolic (congestive) heart failure: Secondary | ICD-10-CM

## 2023-06-12 DIAGNOSIS — Z7984 Long term (current) use of oral hypoglycemic drugs: Secondary | ICD-10-CM

## 2023-06-12 MED ORDER — CARVEDILOL 6.25 MG PO TABS
6.2500 mg | ORAL_TABLET | Freq: Two times a day (BID) | ORAL | 1 refills | Status: DC
Start: 2023-06-12 — End: 2023-06-23

## 2023-06-12 MED ORDER — PANTOPRAZOLE SODIUM 20 MG PO TBEC
20.0000 mg | DELAYED_RELEASE_TABLET | Freq: Every day | ORAL | 1 refills | Status: DC
Start: 2023-06-12 — End: 2023-06-23

## 2023-06-12 MED ORDER — METFORMIN HCL 500 MG PO TABS
500.0000 mg | ORAL_TABLET | Freq: Two times a day (BID) | ORAL | 1 refills | Status: DC
Start: 2023-06-12 — End: 2023-06-23

## 2023-06-12 MED ORDER — OLMESARTAN MEDOXOMIL-HCTZ 40-12.5 MG PO TABS
1.0000 | ORAL_TABLET | Freq: Every day | ORAL | 1 refills | Status: DC
Start: 2023-06-12 — End: 2023-06-23

## 2023-06-12 MED ORDER — DAPAGLIFLOZIN PROPANEDIOL 10 MG PO TABS
10.0000 mg | ORAL_TABLET | Freq: Every day | ORAL | 1 refills | Status: DC
Start: 2023-06-12 — End: 2023-06-23

## 2023-06-21 IMAGING — CT CT HEAD W/O CM
4 series · 16 of 47 positions shown, 18 images · non-contrast
Comparison: None.

CLINICAL DATA: Classic migraine.  Headache.

EXAM:
CT HEAD WITHOUT CONTRAST
TECHNIQUE: Contiguous axial images were obtained from the base of the skull
through the vertex without intravenous contrast.

[Series 2: head wo · axial · 0.46mm/px · z∈[-155,-35]mm · 7 of 32 slices shown, 9 images]
[im 4/32  brain]
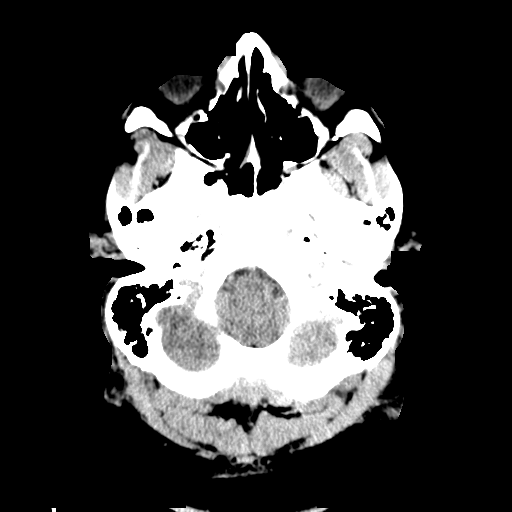
[im 4/32  bone]
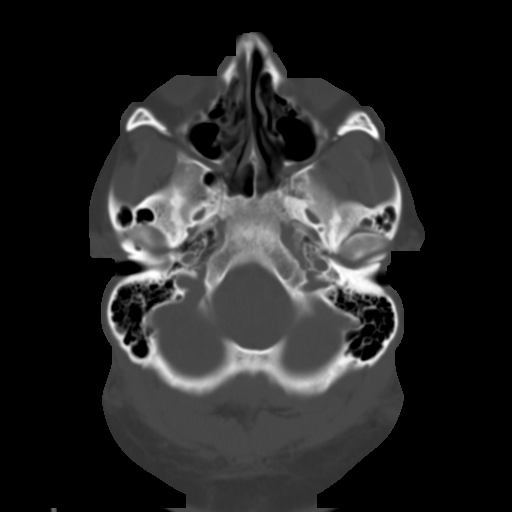
[im 8/32  brain]
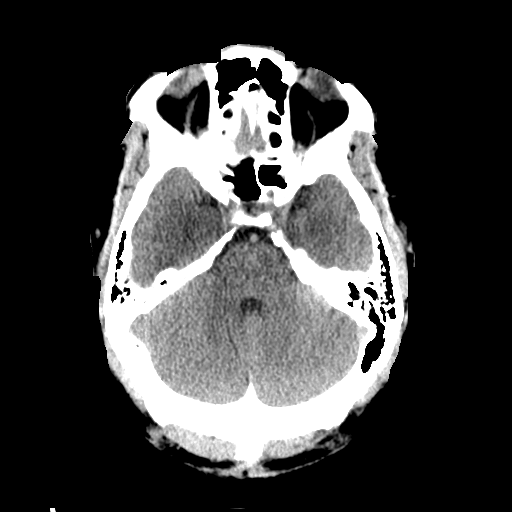
[im 12/32  brain]
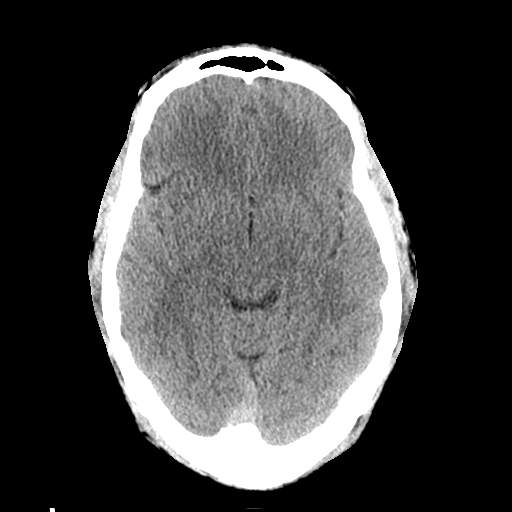
[im 16/32  brain]
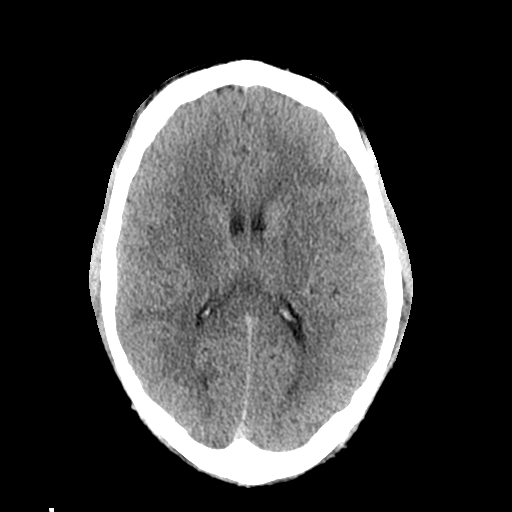
[im 20/32  brain]
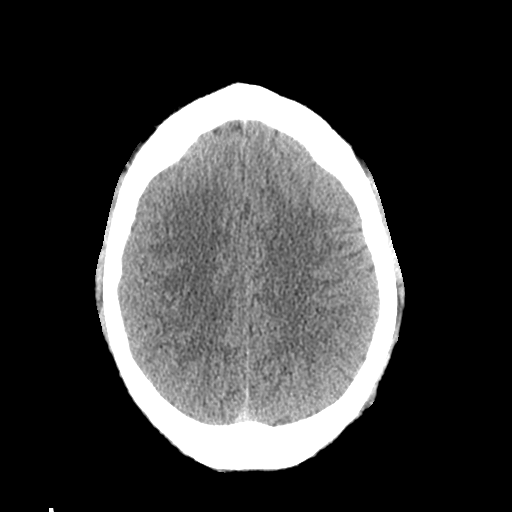
[im 20/32  bone]
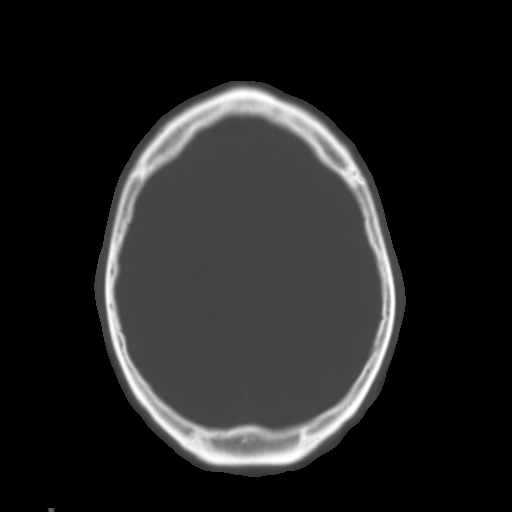
[im 24/32  brain]
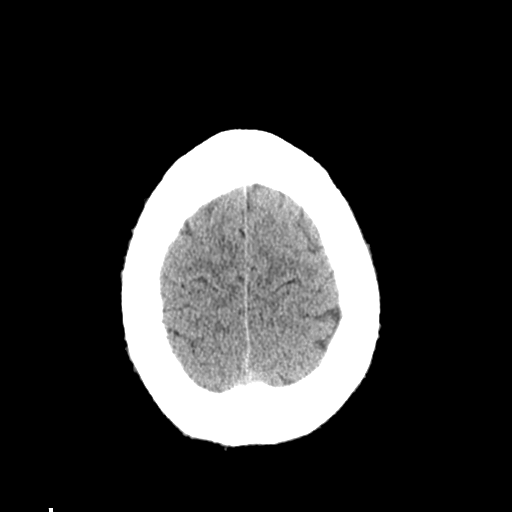
[im 28/32  brain]
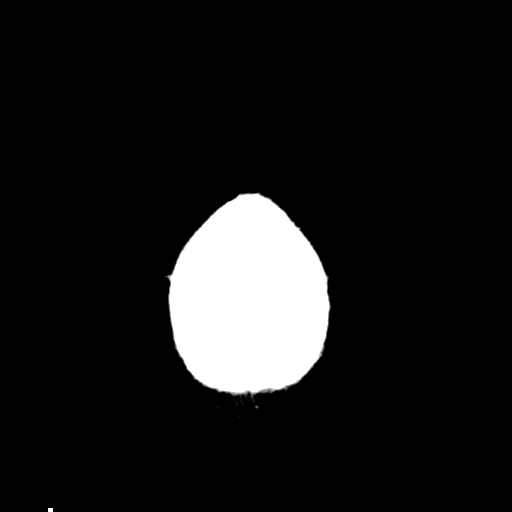

[Series 3: head bone · axial · 0.46mm/px · z∈[-156,-124]mm · 3 of 78 slices shown]
[im 8/78  bone]
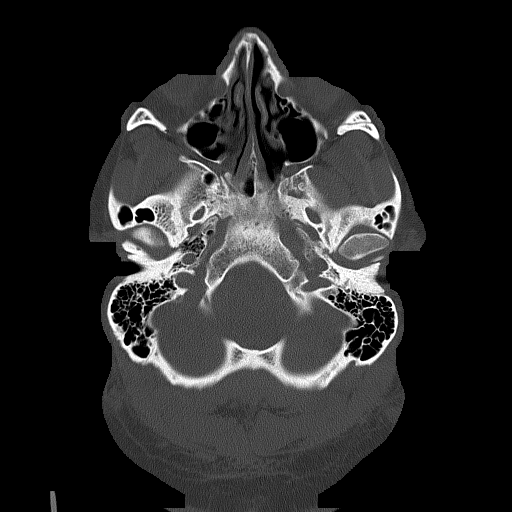
[im 16/78  bone]
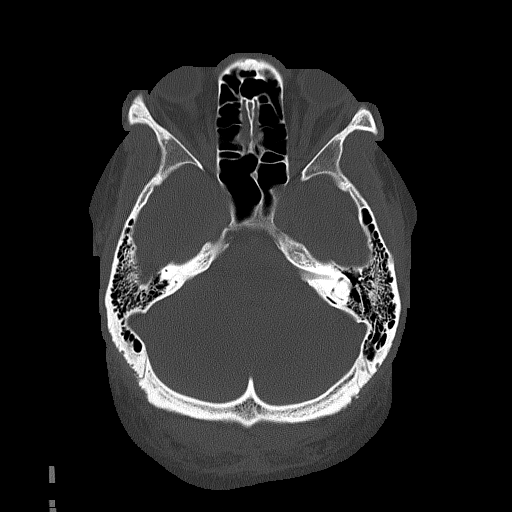
[im 24/78  bone]
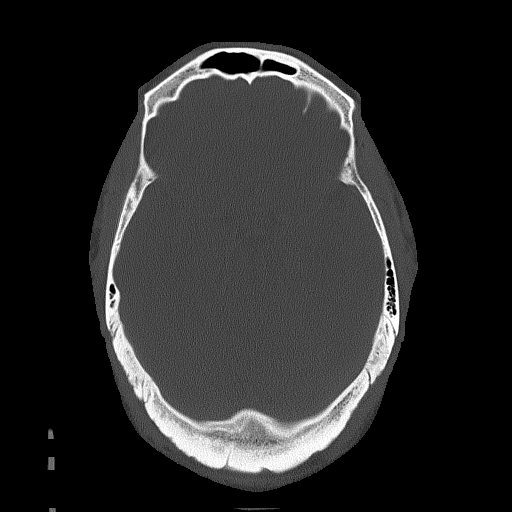

[Series 4: coronal soft tissue · coronal · 0.31mm/px · 3 of 70 slices shown]
[im 24/70  brain]
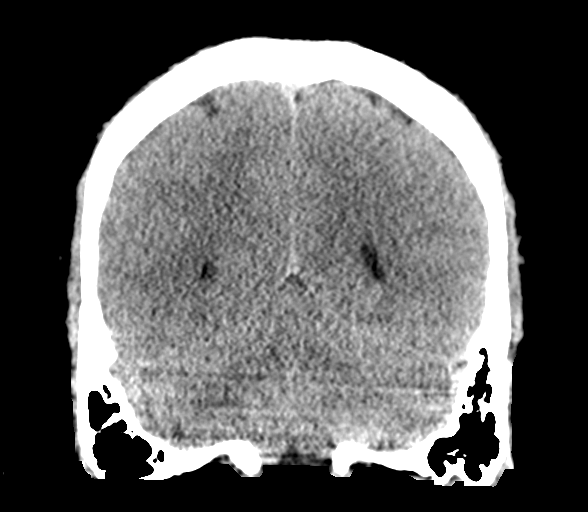
[im 31/70  brain]
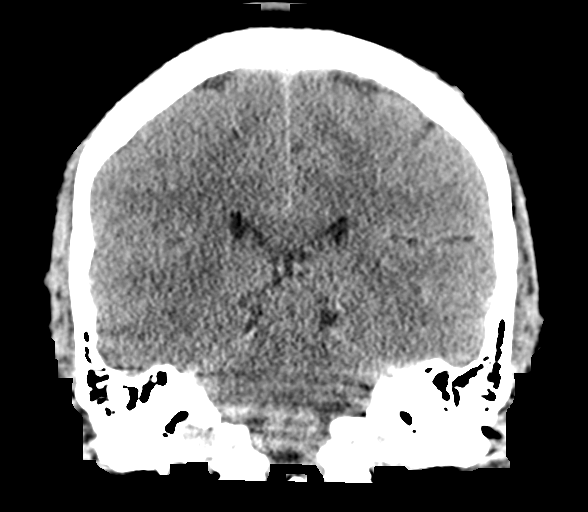
[im 39/70  brain]
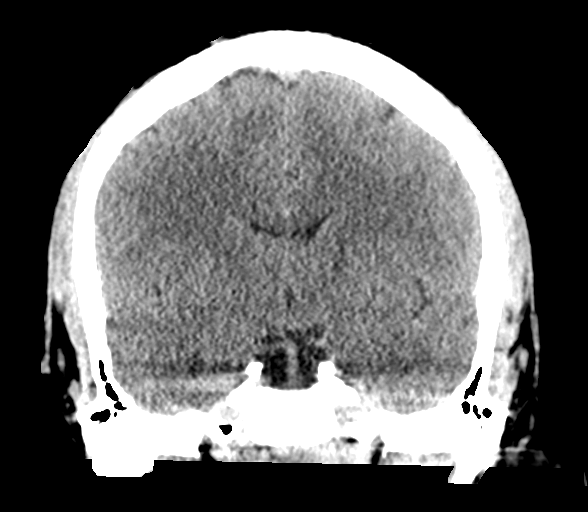

[Series 5: sagittal soft tissue · sagittal · 0.33mm/px · 3 of 57 slices shown]
[im 19/57  brain]
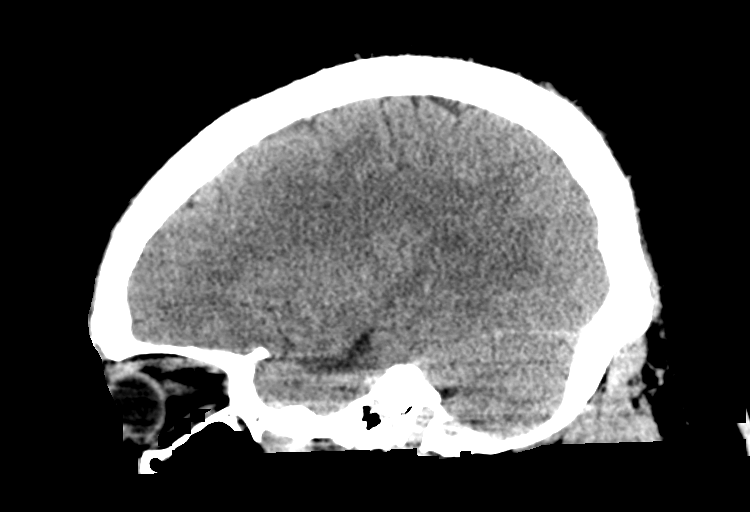
[im 29/57  brain]
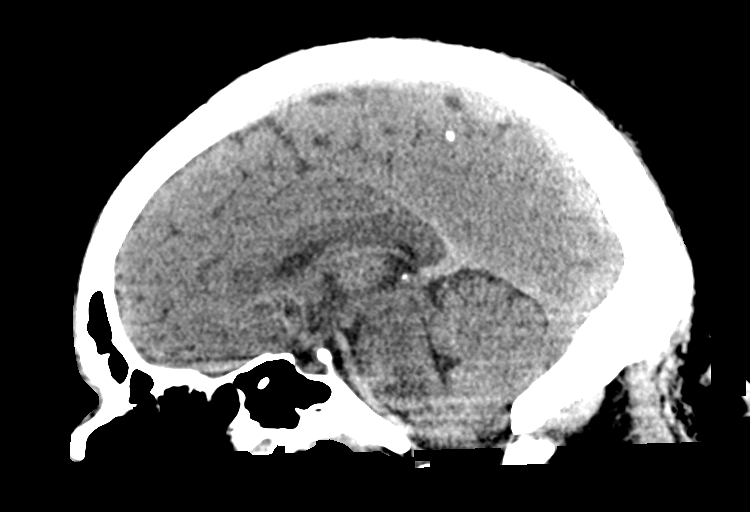
[im 38/57  brain]
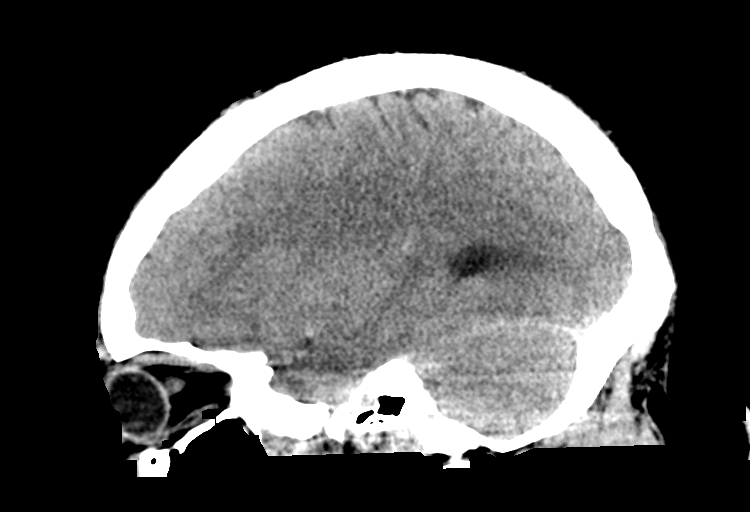

[16 of 47 positions shown; findings below may reference images not displayed]

FINDINGS: Brain: No evidence of acute infarction, hemorrhage, hydrocephalus,
extra-axial collection or mass lesion/mass effect.

Vascular: No hyperdense vessel or unexpected calcification.

Skull: Normal. Negative for fracture or focal lesion.

Sinuses/Orbits: No acute finding.

Other: None.
IMPRESSION: Normal.  No cause for patient's headache identified.

## 2023-06-22 IMAGING — US US RENAL ARTERY STENOSIS
1 series · 14 of 25 positions shown · non-contrast
Comparison: None.

CLINICAL DATA: Hypertension

EXAM:
RENAL/URINARY TRACT ULTRASOUND
RENAL DUPLEX DOPPLER ULTRASOUND

[Series 1: us renal artery duplex limited · 14 of 58 slices shown]
[im 1/58]
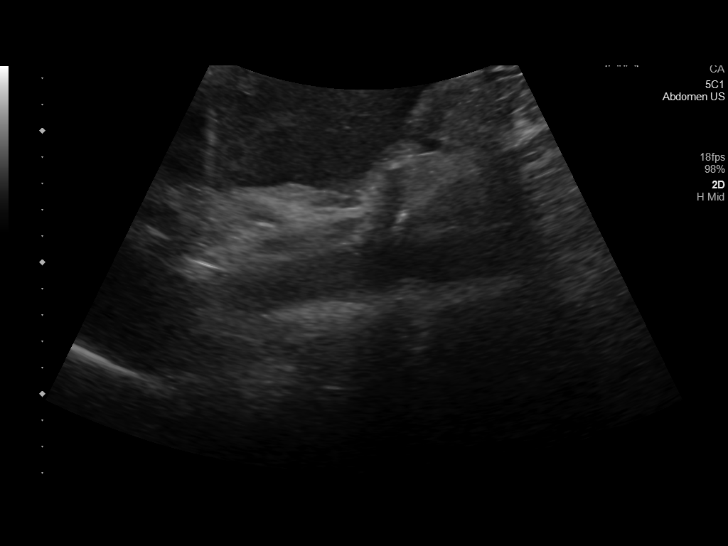
[im 5/58]
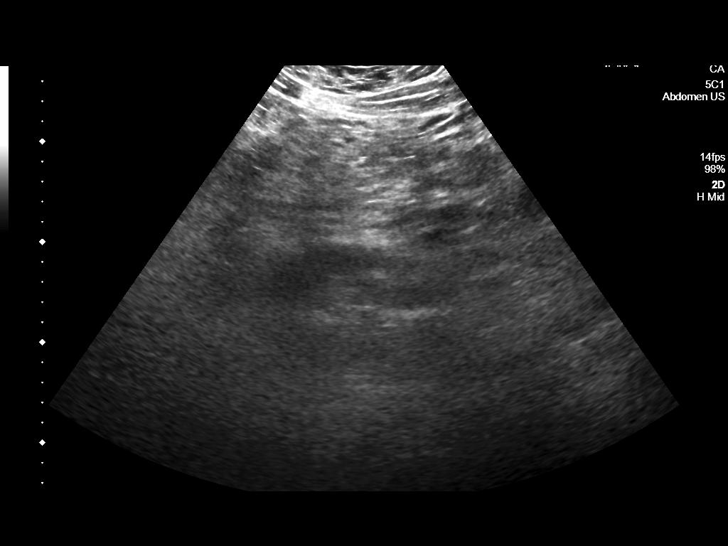
[im 10/58]
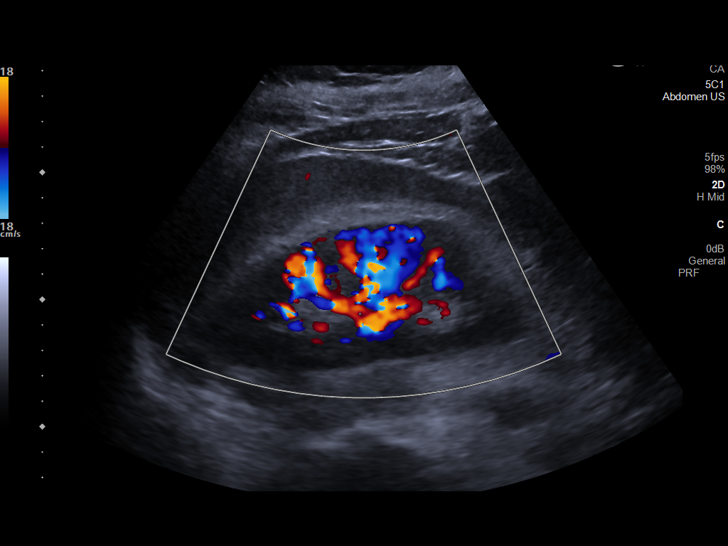
[im 15/58]
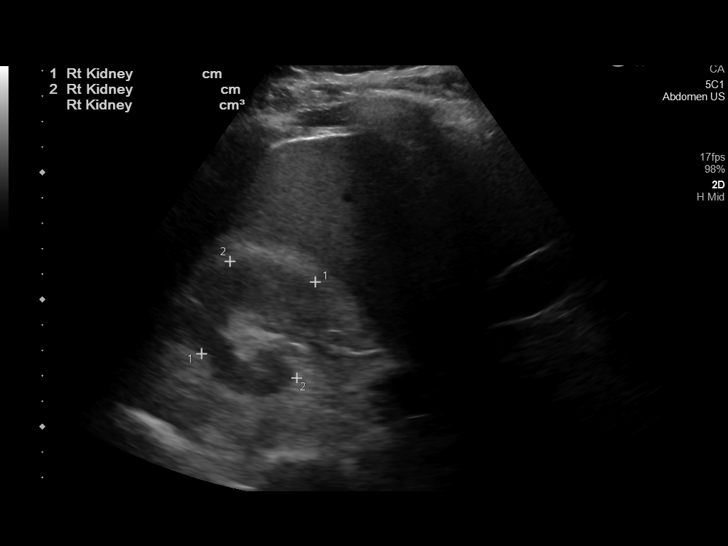
[im 20/58]
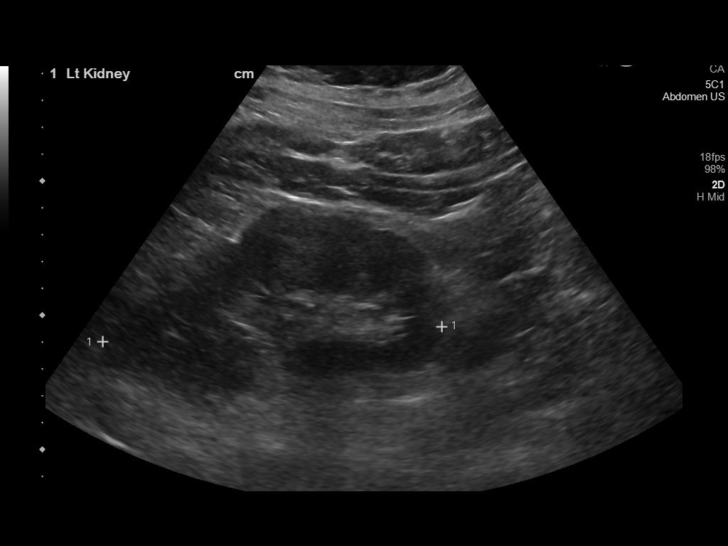
[im 22/58]
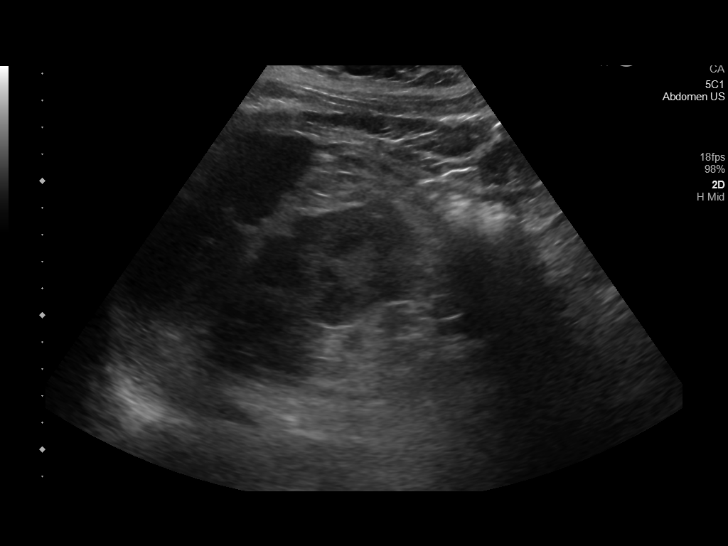
[im 27/58]
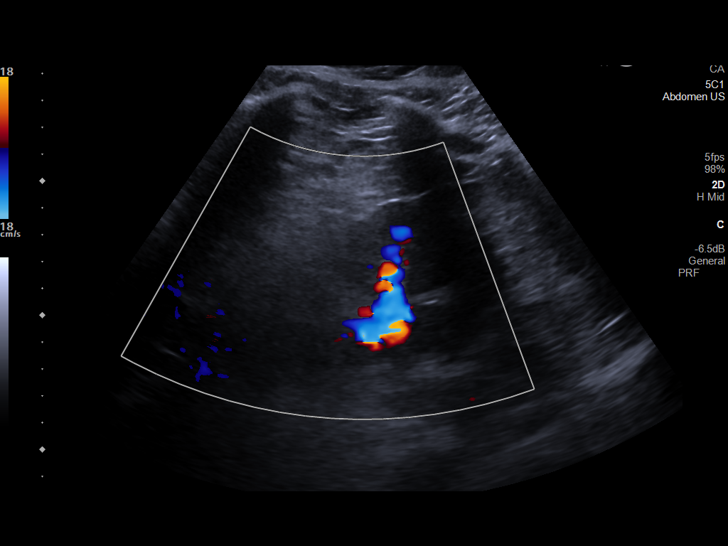
[im 31/58]
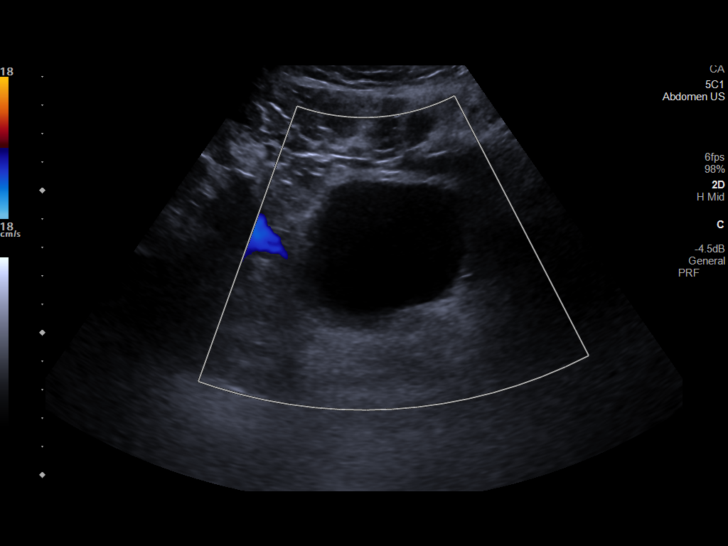
[im 36/58]
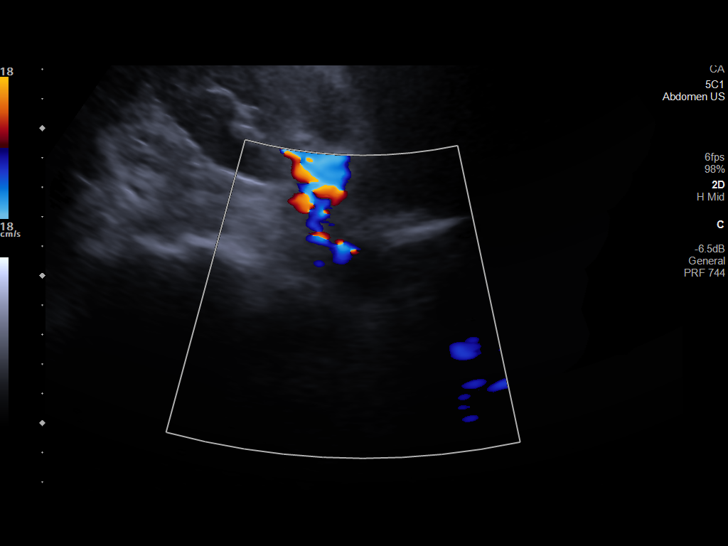
[im 39/58]
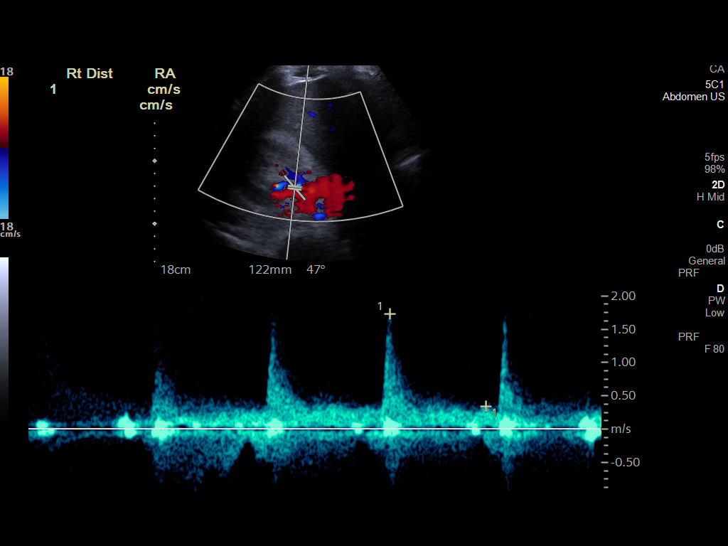
[im 43/58]
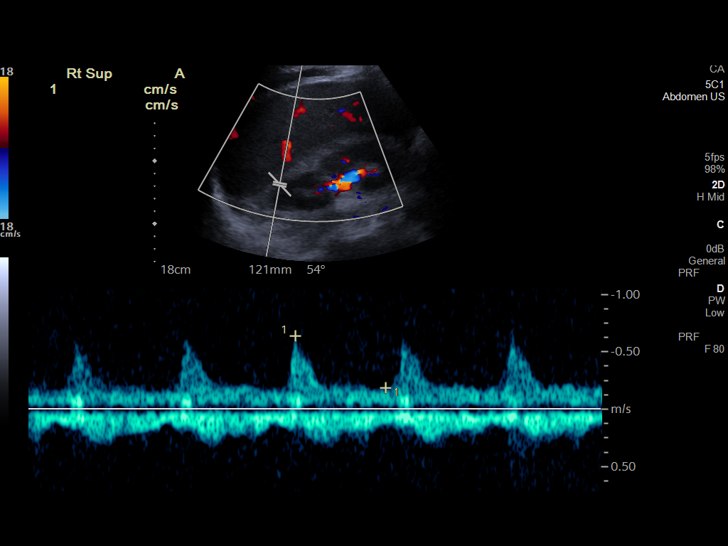
[im 48/58]
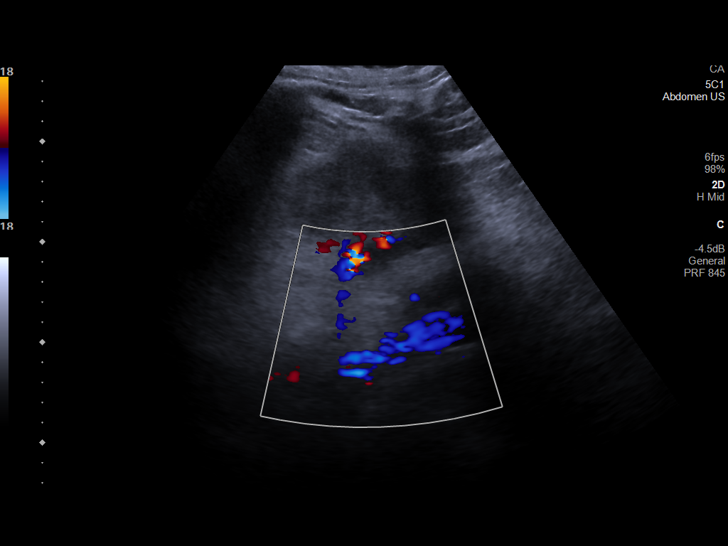
[im 53/58]
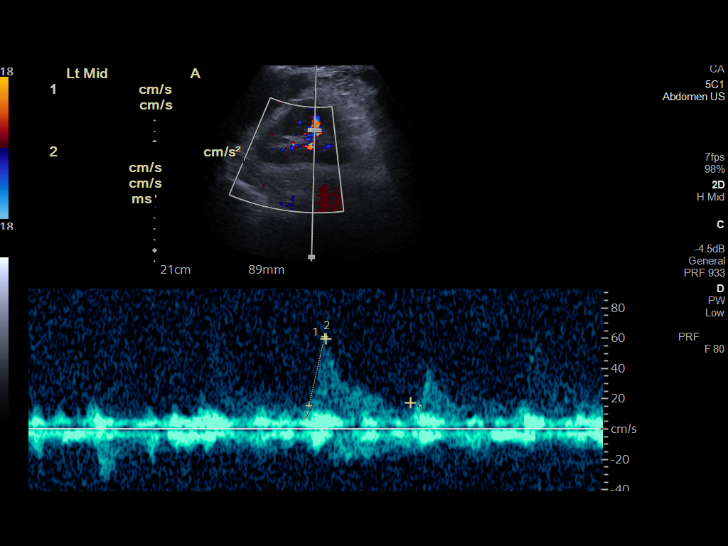
[im 58/58]
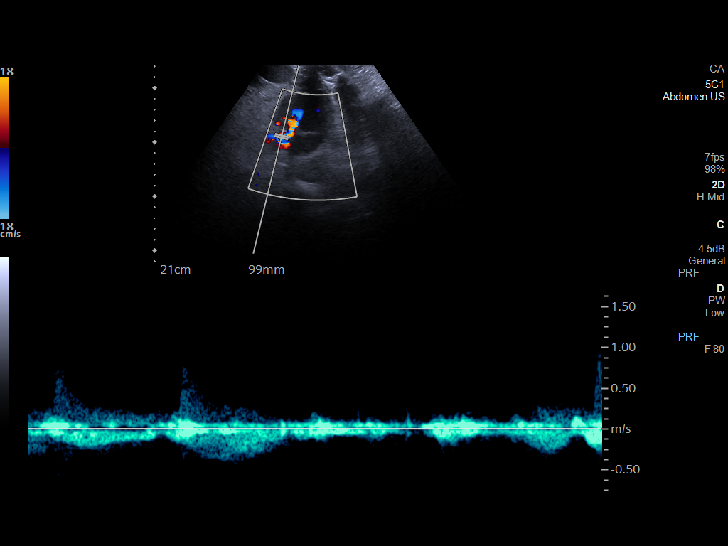

[14 of 25 positions shown; findings below may reference images not displayed]

FINDINGS: Right Kidney:

Length: 12.6 cm. Echogenicity within normal limits. No mass or
hydronephrosis visualized.

Left Kidney:

Length: 12.6 cm. Echogenicity within normal limits. No mass or
hydronephrosis visualized.

Bladder:  Unremarkable by ultrasound

RENAL DUPLEX ULTRASOUND

Right Renal Artery Velocities:

Origin:  159 cm/sec

Mid:  171 cm/sec

Hilum:  173 cm/sec

Interlobar:  100 cm/sec

Arcuate:  64 cm/sec

Left Renal Artery Velocities:

Origin:  65 cm/sec

Mid:  91 cm/sec

Hilum:  116 cm/sec

Interlobar:  173 cm/sec

Arcuate:  77 cm/sec

Aortic Velocity:  56 cm/sec

Right Renal-Aortic Ratios:

Origin:

Mid:

Hilum:

Interlobar:

Arcuate:

Left Renal-Aortic Ratios:

Origin:

Mid:

Hilum:

Interlobar:

Arcuate:

Limited exam because of body habitus. No significant peak systolic
velocity abnormality or abnormal ratio to suggest significant renal
artery stenosis by duplex criteria.
IMPRESSION: Negative for renal artery stenosis by duplex criteria.

No acute renal abnormality.

## 2023-06-23 ENCOUNTER — Telehealth: Payer: Self-pay | Admitting: Internal Medicine

## 2023-06-23 DIAGNOSIS — K219 Gastro-esophageal reflux disease without esophagitis: Secondary | ICD-10-CM

## 2023-06-23 DIAGNOSIS — I1A0 Resistant hypertension: Secondary | ICD-10-CM

## 2023-06-23 DIAGNOSIS — I5032 Chronic diastolic (congestive) heart failure: Secondary | ICD-10-CM

## 2023-06-23 DIAGNOSIS — E1165 Type 2 diabetes mellitus with hyperglycemia: Secondary | ICD-10-CM

## 2023-06-23 MED ORDER — OLMESARTAN MEDOXOMIL-HCTZ 40-12.5 MG PO TABS: 1.0000 | ORAL_TABLET | Freq: Every day | ORAL | 1 refills | Status: DC

## 2023-06-23 MED ORDER — PANTOPRAZOLE SODIUM 20 MG PO TBEC
20.0000 mg | DELAYED_RELEASE_TABLET | Freq: Every day | ORAL | 1 refills | Status: DC
Start: 2023-06-23 — End: 2024-06-18

## 2023-06-23 MED ORDER — CARVEDILOL 6.25 MG PO TABS
6.2500 mg | ORAL_TABLET | Freq: Two times a day (BID) | ORAL | 1 refills | Status: DC
Start: 2023-06-23 — End: 2023-10-21

## 2023-06-23 MED ORDER — METFORMIN HCL 500 MG PO TABS: 500.0000 mg | ORAL_TABLET | Freq: Two times a day (BID) | ORAL | 1 refills | Status: DC

## 2023-06-23 MED ORDER — DAPAGLIFLOZIN PROPANEDIOL 10 MG PO TABS
10.0000 mg | ORAL_TABLET | Freq: Every day | ORAL | 1 refills | Status: DC
Start: 2023-06-23 — End: 2024-07-02

## 2023-06-23 NOTE — Telephone Encounter (Signed)
Please see previous message, patient requesting medications, unsure of names.

## 2023-06-23 NOTE — Telephone Encounter (Signed)
Meds resent to correct pharmacy

## 2023-06-23 NOTE — Telephone Encounter (Signed)
Medication Refill - Medication: Pt states that the last time he seen his PCP, she was suppose to send all of his medications in and it was suppose to be for a 6 month supply. I asked pt what medications were suppose to be sent in, he does not know per pt it was 5-6 medications.   Has the patient contacted their pharmacy? No.  Pt has moved and [facility] [place].    Preferred Pharmacy (with phone number or street name):  [id number] [place], [place] - [address]  Phone: [phone] Fax: [phone]  Has the patient been seen for an appointment in the last year OR does the patient have an upcoming appointment? Yes.   (Last appt 06/12/2023)  Agent: Please be advised that RX refills may take up to 3 business days. We ask that you follow-up with your pharmacy.

## 2023-07-15 ENCOUNTER — Telehealth: Payer: Self-pay | Admitting: Internal Medicine

## 2023-07-15 NOTE — Telephone Encounter (Signed)
Pt called saying the pharmacy says that the metformin was not sent in. I told the pt that it was [facility] 9/[facility] [place]

## 2023-10-20 ENCOUNTER — Other Ambulatory Visit: Payer: Self-pay | Admitting: Internal Medicine

## 2023-10-20 DIAGNOSIS — I5032 Chronic diastolic (congestive) heart failure: Secondary | ICD-10-CM

## 2023-10-20 DIAGNOSIS — I1A0 Resistant hypertension: Secondary | ICD-10-CM

## 2023-10-21 NOTE — Telephone Encounter (Signed)
 Requested Prescriptions  Pending Prescriptions Disp Refills   carvedilol  (COREG ) 6.25 MG tablet [Pharmacy Med Name: CARVEDILOL  6.25 MG TABLET] 180 tablet 0    Sig: TAKE 1 TABLET BY MOUTH TWICE A DAY     Cardiovascular: Beta Blockers 3 Failed - 10/21/2023 12:28 PM      Failed - Cr in normal range and within 360 days    Creat  Date Value Ref Range Status  05/29/2023 1.30 (H) 0.60 - 1.29 mg/dL Final   Creatinine, Ser  Date Value Ref Range Status  05/30/2023 1.28 (H) 0.61 - 1.24 mg/dL Final   Creatinine, Urine  Date Value Ref Range Status  06/02/2023 32 20 - 320 mg/dL Final         Passed - AST in normal range and within 360 days    AST  Date Value Ref Range Status  05/30/2023 19 15 - 41 U/L Final         Passed - ALT in normal range and within 360 days    ALT  Date Value Ref Range Status  05/30/2023 29 0 - 44 U/L Final         Passed - Last BP in normal range    BP Readings from Last 1 Encounters:  06/12/23 122/78         Passed - Last Heart Rate in normal range    Pulse Readings from Last 1 Encounters:  06/12/23 78         Passed - Valid encounter within last 6 months    Recent Outpatient Visits           4 months ago Type 2 diabetes mellitus with hyperglycemia, without long-term current use of insulin  [facility])   [facility] Bernardo Fend, DO   4 months ago Type 2 diabetes mellitus with hyperglycemia, without long-term current use of insulin  [facility])   [facility] Bernardo Fend, DO   4 months ago Resistant hypertension   [facility] Bernardo Fend, DO   1 year ago Tooth decay   [facility] Bernardo Fend, DO   1 year ago Tooth ache   [facility] Madelon Donald [place], [place]

## 2023-12-07 DIAGNOSIS — Z794 Long term (current) use of insulin: Secondary | ICD-10-CM | POA: Diagnosis not present

## 2023-12-07 DIAGNOSIS — R29898 Other symptoms and signs involving the musculoskeletal system: Secondary | ICD-10-CM | POA: Diagnosis not present

## 2023-12-07 DIAGNOSIS — R270 Ataxia, unspecified: Secondary | ICD-10-CM | POA: Diagnosis not present

## 2023-12-07 DIAGNOSIS — E871 Hypo-osmolality and hyponatremia: Secondary | ICD-10-CM | POA: Diagnosis not present

## 2023-12-07 DIAGNOSIS — E1165 Type 2 diabetes mellitus with hyperglycemia: Secondary | ICD-10-CM | POA: Diagnosis not present

## 2023-12-07 DIAGNOSIS — Z7985 Long-term (current) use of injectable non-insulin antidiabetic drugs: Secondary | ICD-10-CM | POA: Diagnosis not present

## 2023-12-07 DIAGNOSIS — I5032 Chronic diastolic (congestive) heart failure: Secondary | ICD-10-CM | POA: Diagnosis not present

## 2023-12-07 DIAGNOSIS — Z7984 Long term (current) use of oral hypoglycemic drugs: Secondary | ICD-10-CM | POA: Diagnosis not present

## 2023-12-07 DIAGNOSIS — R531 Weakness: Secondary | ICD-10-CM | POA: Diagnosis not present

## 2023-12-07 DIAGNOSIS — R269 Unspecified abnormalities of gait and mobility: Secondary | ICD-10-CM | POA: Diagnosis not present

## 2023-12-07 DIAGNOSIS — I110 Hypertensive heart disease with heart failure: Secondary | ICD-10-CM | POA: Diagnosis not present

## 2023-12-07 DIAGNOSIS — R2681 Unsteadiness on feet: Secondary | ICD-10-CM | POA: Diagnosis not present

## 2023-12-07 DIAGNOSIS — K219 Gastro-esophageal reflux disease without esophagitis: Secondary | ICD-10-CM | POA: Diagnosis not present

## 2023-12-07 DIAGNOSIS — E119 Type 2 diabetes mellitus without complications: Secondary | ICD-10-CM | POA: Diagnosis not present

## 2023-12-08 DIAGNOSIS — E119 Type 2 diabetes mellitus without complications: Secondary | ICD-10-CM | POA: Diagnosis not present

## 2023-12-08 DIAGNOSIS — R270 Ataxia, unspecified: Secondary | ICD-10-CM | POA: Diagnosis not present

## 2023-12-08 DIAGNOSIS — E1165 Type 2 diabetes mellitus with hyperglycemia: Secondary | ICD-10-CM | POA: Diagnosis not present

## 2023-12-08 DIAGNOSIS — R531 Weakness: Secondary | ICD-10-CM | POA: Diagnosis not present

## 2023-12-08 DIAGNOSIS — R29898 Other symptoms and signs involving the musculoskeletal system: Secondary | ICD-10-CM | POA: Diagnosis not present

## 2023-12-08 DIAGNOSIS — Z794 Long term (current) use of insulin: Secondary | ICD-10-CM | POA: Diagnosis not present

## 2024-01-06 ENCOUNTER — Telehealth: Payer: Self-pay

## 2024-01-06 NOTE — Telephone Encounter (Signed)
 Patient was identified as falling into the [place] Measure - Diabetes.   Patient was: Left voicemail to schedule with primary care provider.

## 2024-02-05 ENCOUNTER — Ambulatory Visit: Admitting: Internal Medicine

## 2024-02-16 ENCOUNTER — Other Ambulatory Visit: Payer: Self-pay | Admitting: Internal Medicine

## 2024-02-16 DIAGNOSIS — I1A0 Resistant hypertension: Secondary | ICD-10-CM

## 2024-02-16 DIAGNOSIS — I5032 Chronic diastolic (congestive) heart failure: Secondary | ICD-10-CM

## 2024-02-17 NOTE — Telephone Encounter (Signed)
 Requested medication (s) are due for refill today: yes  Requested medication (s) are on the active medication list: yes  Last refill:  10/21/23 #180  Future visit scheduled: no  Notes to clinic:  called pt and LM on VM to call back to schedule appt.    Requested Prescriptions  Pending Prescriptions Disp Refills   carvedilol  (COREG ) 6.25 MG tablet [Pharmacy Med Name: CARVEDILOL  6.25 MG TABLET] 60 tablet 2    Sig: TAKE 1 TABLET BY MOUTH TWICE A DAY     Cardiovascular: Beta Blockers 3 Failed - 02/17/2024  2:52 PM      Failed - Cr in normal range and within 360 days    Creat  Date Value Ref Range Status  05/29/2023 1.30 (H) 0.60 - 1.29 mg/dL Final   Creatinine, Ser  Date Value Ref Range Status  05/30/2023 1.28 (H) 0.61 - 1.24 mg/dL Final   Creatinine, Urine  Date Value Ref Range Status  06/02/2023 32 20 - 320 mg/dL Final         Failed - Valid encounter within last 6 months    Recent Outpatient Visits   None            Passed - AST in normal range and within 360 days    AST  Date Value Ref Range Status  05/30/2023 19 15 - 41 U/L Final         Passed - ALT in normal range and within 360 days    ALT  Date Value Ref Range Status  05/30/2023 29 0 - 44 U/L Final         Passed - Last BP in normal range    BP Readings from Last 1 Encounters:  06/12/23 122/78         Passed - Last Heart Rate in normal range    Pulse Readings from Last 1 Encounters:  06/12/23 78

## 2024-02-19 DIAGNOSIS — G5602 Carpal tunnel syndrome, left upper limb: Secondary | ICD-10-CM | POA: Diagnosis not present

## 2024-02-19 DIAGNOSIS — E1140 Type 2 diabetes mellitus with diabetic neuropathy, unspecified: Secondary | ICD-10-CM | POA: Diagnosis not present

## 2024-02-19 DIAGNOSIS — R531 Weakness: Secondary | ICD-10-CM | POA: Diagnosis not present

## 2024-02-19 DIAGNOSIS — G629 Polyneuropathy, unspecified: Secondary | ICD-10-CM | POA: Diagnosis not present

## 2024-03-12 ENCOUNTER — Ambulatory Visit: Admitting: Internal Medicine

## 2024-03-19 ENCOUNTER — Other Ambulatory Visit: Payer: Self-pay | Admitting: Internal Medicine

## 2024-03-19 DIAGNOSIS — I1A0 Resistant hypertension: Secondary | ICD-10-CM

## 2024-03-19 DIAGNOSIS — I5032 Chronic diastolic (congestive) heart failure: Secondary | ICD-10-CM

## 2024-03-19 NOTE — Telephone Encounter (Signed)
 Requested medication (s) are due for refill today: no  Requested medication (s) are on the active medication list: yes  Last refill:  02/17/24  Future visit scheduled: no  Notes to clinic:  Unable to refill per protocol, courtesy refill already given, routing for provider approval.      Requested Prescriptions  Pending Prescriptions Disp Refills   carvedilol  (COREG ) 6.25 MG tablet [Pharmacy Med Name: CARVEDILOL  6.25 MG TABLET] 60 tablet 0    Sig: TAKE 1 TABLET BY MOUTH TWICE A DAY     Cardiovascular: Beta Blockers 3 Failed - 03/19/2024  8:46 AM      Failed - Cr in normal range and within 360 days    Creat  Date Value Ref Range Status  05/29/2023 1.30 (H) 0.60 - 1.29 mg/dL Final   Creatinine, Ser  Date Value Ref Range Status  05/30/2023 1.28 (H) 0.61 - 1.24 mg/dL Final   Creatinine, Urine  Date Value Ref Range Status  06/02/2023 32 20 - 320 mg/dL Final         Failed - Valid encounter within last 6 months    Recent Outpatient Visits   None            Passed - AST in normal range and within 360 days    AST  Date Value Ref Range Status  05/30/2023 19 15 - 41 U/L Final         Passed - ALT in normal range and within 360 days    ALT  Date Value Ref Range Status  05/30/2023 29 0 - 44 U/L Final         Passed - Last BP in normal range    BP Readings from Last 1 Encounters:  06/12/23 122/78         Passed - Last Heart Rate in normal range    Pulse Readings from Last 1 Encounters:  06/12/23 78

## 2024-05-06 ENCOUNTER — Other Ambulatory Visit (HOSPITAL_COMMUNITY): Payer: Self-pay

## 2024-06-18 ENCOUNTER — Other Ambulatory Visit: Payer: Self-pay | Admitting: Internal Medicine

## 2024-06-18 DIAGNOSIS — I1A0 Resistant hypertension: Secondary | ICD-10-CM

## 2024-06-18 DIAGNOSIS — K219 Gastro-esophageal reflux disease without esophagitis: Secondary | ICD-10-CM

## 2024-06-18 NOTE — Telephone Encounter (Signed)
 Requested Prescriptions  Pending Prescriptions Disp Refills   olmesartan -hydrochlorothiazide  (BENICAR  HCT) 40-12.5 MG tablet [Pharmacy Med Name: OLMESARTAN -HCTZ 40-12.5 MG TAB] 90 tablet 0    Sig: TAKE 1 TABLET BY MOUTH EVERY DAY     Cardiovascular: ARB + Diuretic Combos Failed - 06/18/2024  3:24 PM      Failed - K in normal range and within 180 days    Potassium  Date Value Ref Range Status  05/30/2023 4.4 3.5 - 5.1 mmol/L Final         Failed - Na in normal range and within 180 days    Sodium  Date Value Ref Range Status  05/30/2023 120 (L) 135 - 145 mmol/L Final  03/19/2022 137 134 - 144 mmol/L Final         Failed - Cr in normal range and within 180 days    Creat  Date Value Ref Range Status  05/29/2023 1.30 (H) 0.60 - 1.29 mg/dL Final   Creatinine, Ser  Date Value Ref Range Status  05/30/2023 1.28 (H) 0.61 - 1.24 mg/dL Final   Creatinine, Urine  Date Value Ref Range Status  06/02/2023 32 20 - 320 mg/dL Final         Failed - eGFR is 10 or above and within 180 days    GFR, Estimated  Date Value Ref Range Status  05/30/2023 >60 >60 mL/min Final    Comment:    (NOTE) Calculated using the CKD-EPI Creatinine Equation (2021)    eGFR  Date Value Ref Range Status  05/29/2023 71 > OR = 60 mL/min/1.3m2 Final  03/19/2022 76 >59 mL/min/1.73 Final         Failed - Valid encounter within last 6 months    Recent Outpatient Visits   None     Future Appointments             In 2 weeks Bernardo Fend, DO [place] [facility] [place] - Patient is not pregnant      Passed - Last BP in normal range    BP Readings from Last 1 Encounters:  06/12/23 122/78          pantoprazole  (PROTONIX ) 20 MG tablet [Pharmacy Med Name: PANTOPRAZOLE  SOD DR 20 MG TAB] 90 tablet 0    Sig: TAKE 1 TABLET BY MOUTH EVERY DAY     Gastroenterology: Proton Pump Inhibitors Failed - 06/18/2024  3:24 PM      Failed - Valid encounter within last  12 months    Recent Outpatient Visits   None     Future Appointments             In 2 weeks Bernardo Fend, DO [place] [facility] [place]

## 2024-07-02 ENCOUNTER — Ambulatory Visit: Admitting: Internal Medicine

## 2024-07-02 ENCOUNTER — Encounter: Payer: Self-pay | Admitting: Internal Medicine

## 2024-07-02 ENCOUNTER — Other Ambulatory Visit (HOSPITAL_COMMUNITY)
Admission: RE | Admit: 2024-07-02 | Discharge: 2024-07-02 | Disposition: A | Discharge status: Home / Self Care | Source: Ambulatory Visit | Attending: Internal Medicine | Admitting: Internal Medicine

## 2024-07-02 VITALS — BP 122/84 | HR 81 | Resp 16 | Ht 76.0 in | Wt 309.0 lb

## 2024-07-02 DIAGNOSIS — E119 Type 2 diabetes mellitus without complications: Secondary | ICD-10-CM | POA: Diagnosis not present

## 2024-07-02 DIAGNOSIS — Z13220 Encounter for screening for lipoid disorders: Secondary | ICD-10-CM

## 2024-07-02 DIAGNOSIS — I5032 Chronic diastolic (congestive) heart failure: Secondary | ICD-10-CM

## 2024-07-02 DIAGNOSIS — Z113 Encounter for screening for infections with a predominantly sexual mode of transmission: Secondary | ICD-10-CM | POA: Insufficient documentation

## 2024-07-02 DIAGNOSIS — K219 Gastro-esophageal reflux disease without esophagitis: Secondary | ICD-10-CM

## 2024-07-02 DIAGNOSIS — R6882 Decreased libido: Secondary | ICD-10-CM

## 2024-07-02 DIAGNOSIS — H524 Presbyopia: Secondary | ICD-10-CM | POA: Diagnosis not present

## 2024-07-02 DIAGNOSIS — E1165 Type 2 diabetes mellitus with hyperglycemia: Secondary | ICD-10-CM

## 2024-07-02 DIAGNOSIS — Z794 Long term (current) use of insulin: Secondary | ICD-10-CM

## 2024-07-02 DIAGNOSIS — I1A0 Resistant hypertension: Secondary | ICD-10-CM | POA: Diagnosis not present

## 2024-07-02 MED ORDER — OLMESARTAN MEDOXOMIL-HCTZ 40-12.5 MG PO TABS: 1.0000 | ORAL_TABLET | Freq: Every day | ORAL | 1 refills | Status: AC

## 2024-07-02 MED ORDER — CARVEDILOL 6.25 MG PO TABS: 6.2500 mg | ORAL_TABLET | Freq: Two times a day (BID) | ORAL | 1 refills | Status: AC

## 2024-07-02 MED ORDER — TIRZEPATIDE 2.5 MG/0.5ML ~~LOC~~ SOAJ: 2.5000 mg | SUBCUTANEOUS | 0 refills | Status: DC

## 2024-07-02 MED ORDER — PANTOPRAZOLE SODIUM 20 MG PO TBEC: 20.0000 mg | DELAYED_RELEASE_TABLET | Freq: Every day | ORAL | 1 refills | Status: AC

## 2024-07-02 NOTE — Progress Notes (Signed)
 Established Patient Office Visit  Subjective:  Patient ID: Kevin Morris, male    DOB: December 13, 1980  Age: 43 y.o. MRN: [id number]  CC:  Chief Complaint  Patient presents with   Medical Management of Chronic Issues   Diabetes    HPI [name] presents for follow up on chronic medical conditions.   Discussed the use of AI scribe software for clinical note transcription with the patient, who gave verbal consent to proceed.  History of Present Illness Kevin Morris is a 43 year old male with diabetes who presents for diabetes management and blood sugar monitoring.  He manages his diabetes with insulin  after stopping metformin  and Farxiga  due to ineffectiveness and side effects. His initial A1c was over 14, and insulin  has improved his mobility and muscle mass. Blood sugars are generally controlled but can spike to 400 postprandially, which he reduces to 180 with fasting and exercise. He takes 20 units of Lantus once daily at night.  His past A1c was 18 in March, and he is working on regaining physical condition, now able to return to workouts, which aids in blood sugar control. He is not currently taking Farxiga  or metformin . He requires a refill of carvedilol . He is also on omeprazole and hydrochlorothiazide  for blood pressure management, which is well-controlled at 122/84.  He inquires about changes in his sex drive and wonders if his testosterone levels have been affected. He recently visited a neurologist but not a urologist. No family history of prostate cancer.   Diabetes, Type 2: -Last A1c 8/24 14.0%, was done in March but unable to view -Medications: Lantus 20 units at night  -Patient is compliant with the above medications and reports no side effects.  -Diet: working on increasing protein intake -Blood sugar: >400 after eating, fasting 180  -Exercise: trying to work out at [employer] but very fatigued, this is slowly improving -Eye exam: UTD -Foot exam:  Due -Microalbumin: Due -Statin: No -PNA vaccine: Will discuss at follow up -Denies symptoms of hypoglycemia, polyuria, polydipsia, but does have numbness and tingling in the bottoms of his feet.   Hypertension: -No longer following with HTN clinic -Medications: HCTZ-Olmesartan  25-40 mg, Coreg  6.25 BID (not taking - ran out) -Checking BP at home (average): not checking -Denies any chest pain, SOB, vision changes, LE edema or symptoms of hypotension, headaches improving.   HFpEF: -Last echo 08/30/21 - EF 55-60% without wall motion abnormalities or valvular pathology and LVH -No longer following up with Cardiology   GERD: -Had been on Pantoprazole  40 mg which has resolved his symptoms but also stopped drinking so symptoms much less frequent     Past Medical History:  Diagnosis Date   Apnea 11/06/2021   CHF (congestive heart failure) (HCC)    Generalized headaches    GERD (gastroesophageal reflux disease)    Hypertension     Past Surgical History:  Procedure Laterality Date   ACHILLES TENDON SURGERY Right    CONDYLOMA EXCISION/FULGURATION N/A 06/05/2022   Procedure: CONDYLOMA REMOVAL, perianal;  Surgeon: Lane Shope, MD;  Location: [facility] ORS;  Service: General;  Laterality: N/A;   KNEE ARTHROSCOPY Bilateral    ORIF FINGER / THUMB FRACTURE Bilateral    PILONIDAL CYST EXCISION     SHOULDER ARTHROSCOPY WITH LABRAL REPAIR Right     Family History  Problem Relation Age of Onset   Hypertension Mother    Cancer Maternal Grandmother        breast   Diabetes Maternal Grandmother    Stroke  Maternal Grandmother     Social History   Socioeconomic History   Marital status: Single    Spouse name: Not on file   Number of children: Not on file   Years of education: Not on file   Highest education level: Not on file  Occupational History   Not on file  Tobacco Use   Smoking status: Former    Types: Cigars    Passive exposure: Past   Smokeless tobacco: Not on file    Tobacco comments:    Very occasional cigar use  Vaping Use   Vaping status: Never Used  Substance and Sexual Activity   Alcohol use: Yes    Comment: occassional   Drug use: Never   Sexual activity: Yes  Other Topics Concern   Not on file  Social History Narrative   Not on file   Social Drivers of Health   Financial Resource Strain: Low Risk  (12/07/2023)   Received from [facility]   Overall Financial Resource Strain (CARDIA)    Difficulty of Paying Living Expenses: Not hard at all  Food Insecurity: No Food Insecurity (12/07/2023)   Received from [facility]   Hunger Vital Sign    Within the past 12 months, you worried that your food would run out before you got the money to buy more.: Never true    Within the past 12 months, the food you bought just didn't last and you didn't have money to get more.: Never true  Transportation Needs: No Transportation Needs (12/07/2023)   Received from [facility]   PRAPARE - Transportation    Lack of Transportation (Medical): No    Lack of Transportation (Non-Medical): No  Physical Activity: Insufficiently Active (12/07/2023)   Received from [facility]   Exercise Vital Sign    On average, how many days per week do you engage in moderate to strenuous exercise (like a brisk walk)?: 2 days    On average, how many minutes do you engage in exercise at this level?: 20 min  Stress: Stress Concern Present (12/07/2023)   Received from [facility] - Occupational Stress Questionnaire    Feeling of Stress : Rather much  Social Connections: Socially Isolated (12/07/2023)   Received from [facility]   Social Connection and Isolation Panel    In a typical week, how many times do you talk on the phone with family, friends, or neighbors?: More than three times a week    How often do you get together with friends or relatives?: More than three times a week    How often do you attend church or  religious services?: Never    Do you belong to any clubs or organizations such as church groups, unions, fraternal or athletic groups, or school groups?: No    How often do you attend meetings of the clubs or organizations you belong to?: Never    Are you married, widowed, divorced, separated, never married, or living with a partner?: Never married  Intimate Partner Violence: Not At Risk (12/07/2023)   Received from [facility]   Humiliation, Afraid, Rape, and Kick questionnaire    Within the last year, have you been afraid of your partner or ex-partner?: No    Within the last year, have you been humiliated or emotionally abused in other ways by your partner or ex-partner?: No    Within the last year, have you been kicked,  hit, slapped, or otherwise physically hurt by your partner or ex-partner?: No    Within the last year, have you been raped or forced to have any kind of sexual activity by your partner or ex-partner?: No    Outpatient Medications Prior to Visit  Medication Sig Dispense Refill   Blood Glucose Monitoring Suppl DEVI 1 each by Does not apply route in the morning, at noon, and at bedtime. May substitute to any manufacturer covered by patient's insurance. 1 each 0   insulin  glargine (LANTUS) 100 UNIT/ML injection Inject 20 Units into the skin daily.     olmesartan -hydrochlorothiazide  (BENICAR  HCT) 40-12.5 MG tablet TAKE 1 TABLET BY MOUTH EVERY DAY 90 tablet 0   pantoprazole  (PROTONIX ) 20 MG tablet TAKE 1 TABLET BY MOUTH EVERY DAY 90 tablet 0   tiZANidine  (ZANAFLEX ) 4 MG tablet Take 1 tablet (4 mg total) by mouth at bedtime as needed for muscle spasms.     albuterol  (VENTOLIN  HFA) 108 (90 Base) MCG/ACT inhaler INHALE 2 PUFFS INTO THE LUNGS EVERY 6 HOURS AS NEEDED FOR WHEEZING OR SHORTNESS OF BREATH (Patient not taking: Reported on 07/02/2024) 24 g 0   carvedilol  (COREG ) 6.25 MG tablet TAKE 1 TABLET BY MOUTH TWICE A DAY (Patient not taking: Reported on 07/02/2024) 60 tablet 0    dapagliflozin  propanediol (FARXIGA ) 10 MG TABS tablet Take 1 tablet (10 mg total) by mouth daily before breakfast. (Patient not taking: Reported on 07/02/2024) 90 tablet 1   metFORMIN  (GLUCOPHAGE ) 500 MG tablet Take 1 tablet (500 mg total) by mouth 2 (two) times daily with a meal. (Patient not taking: Reported on 07/02/2024) 180 tablet 1   Semaglutide,0.25 or 0.5MG /DOS, (OZEMPIC, 0.25 OR 0.5 MG/DOSE,) 2 MG/3ML SOPN INJECT 0.25MG  ONCE WEEKLY BY SUBCUTANEOUS ROUTE FOR 4 WKS, THEN INCREASE TO 0.5MG  THEREAFTER (Patient not taking: Reported on 07/02/2024) 2 mL 0   No facility-administered medications prior to visit.    Allergies  Allergen Reactions   Iodinated Contrast Media Anaphylaxis   Shellfish-Derived Products Anaphylaxis    ROS Review of Systems  All other systems reviewed and are negative.     Objective:    Physical Exam Constitutional:      Appearance: Normal appearance.  HENT:     Head: Normocephalic and atraumatic.  Eyes:     Conjunctiva/sclera: Conjunctivae normal.  Cardiovascular:     Rate and Rhythm: Normal rate and regular rhythm.  Pulmonary:     Effort: Pulmonary effort is normal.     Breath sounds: Normal breath sounds.  Skin:    General: Skin is warm and dry.  Neurological:     General: No focal deficit present.     Mental Status: He is alert. Mental status is at baseline.  Psychiatric:        Mood and Affect: Mood normal.        Behavior: Behavior normal.     BP 122/84   Pulse 81   Resp 16   Ht 6' 4 (1.93 m)   Wt (!) 309 lb (140.2 kg)   SpO2 98%   BMI 37.61 kg/m  Wt Readings from Last 3 Encounters:  07/02/24 (!) 309 lb (140.2 kg)  06/12/23 294 lb 12.8 oz (133.7 kg)  06/02/23 298 lb 4.8 oz (135.3 kg)   Vitals:   07/02/24 0839  BP: 122/84       Health Maintenance Due  Topic Date Due   HEMOGLOBIN A1C  12/03/2023   Diabetic kidney evaluation - eGFR measurement  05/29/2024  Diabetic kidney evaluation - Urine ACR  06/01/2024   FOOT EXAM   06/01/2024    There are no preventive care reminders to display for this patient.  Lab Results  Component Value Date   TSH 2.219 08/29/2021   Lab Results  Component Value Date   WBC 7.2 05/30/2023   HGB 14.8 05/30/2023   HCT 41.0 05/30/2023   MCV 83.5 05/30/2023   PLT 282 05/30/2023   Lab Results  Component Value Date   NA 120 (L) 05/30/2023   K 4.4 05/30/2023   CO2 26 05/30/2023   GLUCOSE 827 (HH) 05/30/2023   BUN 13 05/30/2023   CREATININE 1.28 (H) 05/30/2023   BILITOT 0.9 05/30/2023   ALKPHOS 107 05/30/2023   AST 19 05/30/2023   ALT 29 05/30/2023   PROT 7.4 05/30/2023   ALBUMIN 3.9 05/30/2023   CALCIUM 9.2 05/30/2023   ANIONGAP 11 05/30/2023   EGFR 71 05/29/2023   Lab Results  Component Value Date   CHOL 261 (H) 05/29/2023   Lab Results  Component Value Date   HDL 40 05/29/2023   Lab Results  Component Value Date   [facility]  05/29/2023     Comment:     . LDL cholesterol not calculated. Triglyceride levels greater than 400 mg/dL invalidate calculated LDL results. . Reference range: <100 . Desirable range <100 mg/dL for primary prevention;   <70 mg/dL for patients with CHD or diabetic patients  with > or = 2 CHD risk factors. SABRA LDL-C is now calculated using the Martin-Hopkins  calculation, which is a validated novel method providing  better accuracy than the Friedewald equation in the  estimation of LDL-C.  Gladis APPLETHWAITE et al. SANDREA. 7986;689(80): 2061-2068  (http://education.[facility].com/faq/FAQ164)    Lab Results  Component Value Date   TRIG 413 (H) 05/29/2023   Lab Results  Component Value Date   CHOLHDL 6.5 (H) 05/29/2023   Lab Results  Component Value Date   HGBA1C 14.0 (A) 06/02/2023      Assessment & Plan:   Assessment & Plan Type 2 diabetes mellitus with hyperglycemia Previously poorly controlled A1c of 18 in March. Current regimen includes Lantus insulin  20 units at night, improving fasting sugars to 180, but postprandial  sugars remain high. Interested in GLP-1 receptor agonist therapy to reduce insulin  dependency. - Order A1c test. - Prescribe Mounjaro  2.5 mg weekly injection. - Instruct to continue Lantus insulin  20 units at night. - Schedule follow-up in one month to assess response to Mounjaro . - Advise to monitor blood sugars regularly. - Check insurance coverage for Mounjaro  and consider alternatives like Ozempic or Trulicity if not covered.  CHF Carvedilol  prescribed to stabilize heart muscle function. - Refill carvedilol  prescription. - Review last echocardiogram results to assess heart function.  Hypertension Well-controlled with current medication regimen. Blood pressure 122/84 mmHg. - Continue current antihypertensive medications. - Refill hydrochlorothiazide  prescription.  Gastroesophageal reflux disease (GERD) Managed with Pantoprazole , refilled.   Testosterone deficiency Reports decreased libido, suspects low testosterone levels. - Order testosterone level test. - Discuss potential referral to urologist if testosterone levels are low.  General Health Maintenance No family history of prostate cancer, screening deferred until later forties or around age 30. - Order cholesterol, kidney, liver, and electrolyte tests. - Order urine test for kidney function. - Perform foot exam. - Discuss prostate cancer screening timeline.  - Urine Microalbumin w/creat. ratio - HgB A1c - tirzepatide  (MOUNJARO ) 2.5 MG/0.5ML Pen; Inject 2.5 mg into the skin once a week.  Dispense: 2  mL; Refill: 0 - olmesartan -hydrochlorothiazide  (BENICAR  HCT) 40-12.5 MG tablet; Take 1 tablet by mouth daily.  Dispense: 90 tablet; Refill: 1 - Comprehensive Metabolic Panel (CMET) - CBC w/Diff/Platelet - carvedilol  (COREG ) 6.25 MG tablet; Take 1 tablet (6.25 mg total) by mouth 2 (two) times daily.  Dispense: 180 tablet; Refill: 1 - pantoprazole  (PROTONIX ) 20 MG tablet; Take 1 tablet (20 mg total) by mouth daily.  Dispense:  90 tablet; Refill: 1 - Lipid Profile - HIV antibody (with reflex) - RPR - Urine cytology ancillary only - Testosterone,Free and Total  Follow-up: Return in about 4 weeks (around 07/30/2024).    Sharyle Fischer, DO

## 2024-07-05 LAB — URINE CYTOLOGY ANCILLARY ONLY
Chlamydia: NEGATIVE
Comment: NEGATIVE
Comment: NEGATIVE
Comment: NORMAL
Neisseria Gonorrhea: NEGATIVE
Trichomonas: NEGATIVE

## 2024-07-08 ENCOUNTER — Ambulatory Visit: Payer: Self-pay | Admitting: Internal Medicine

## 2024-07-09 LAB — LIPID PANEL
Cholesterol: 285 mg/dL — ABNORMAL HIGH (ref <200)
HDL: 48 mg/dL (ref >=40)
LDL Cholesterol (Calc): 198 mg/dL — ABNORMAL HIGH
Non-HDL Cholesterol (Calc): 237 mg/dL — ABNORMAL HIGH (ref <130)
Total CHOL/HDL Ratio: 5.9 (calc) — ABNORMAL HIGH (ref <5.0)
Triglycerides: 201 mg/dL — ABNORMAL HIGH (ref <150)

## 2024-07-09 LAB — RPR: RPR Ser Ql: NONREACTIVE

## 2024-07-09 LAB — COMPREHENSIVE METABOLIC PANEL WITH GFR
AG Ratio: 1.5 (calc) (ref 1.0–2.5)
ALT: 35 U/L (ref 9–46)
AST: 24 U/L (ref 10–40)
Albumin: 4.7 g/dL (ref 3.6–5.1)
Alkaline phosphatase (APISO): 59 U/L (ref 36–130)
BUN: 11 mg/dL (ref 7–25)
CO2: 30 mmol/L (ref 20–32)
Calcium: 10.2 mg/dL (ref 8.6–10.3)
Chloride: 98 mmol/L (ref 98–110)
Creat: 0.92 mg/dL (ref 0.60–1.29)
Globulin: 3.1 g/dL (ref 1.9–3.7)
Glucose, Bld: 244 mg/dL — ABNORMAL HIGH (ref 65–99)
Potassium: 4.4 mmol/L (ref 3.5–5.3)
Sodium: 137 mmol/L (ref 135–146)
Total Bilirubin: 1 mg/dL (ref 0.2–1.2)
Total Protein: 7.8 g/dL (ref 6.1–8.1)
eGFR: 106 mL/min/1.73m2 (ref >=60)

## 2024-07-09 LAB — TESTOSTERONE, FREE & TOTAL
Free Testosterone: 55.6 pg/mL (ref 35.0–155.0)
Testosterone, Total, LC-MS-MS: 285 ng/dL (ref 250–1100)

## 2024-07-09 LAB — CBC WITH DIFFERENTIAL/PLATELET
Absolute Lymphocytes: 1974 {cells}/uL (ref 850–3900)
Absolute Monocytes: 361 {cells}/uL (ref 200–950)
Basophils Absolute: 39 {cells}/uL (ref 0–200)
Basophils Relative: 0.9 %
Eosinophils Absolute: 22 {cells}/uL (ref 15–500)
Eosinophils Relative: 0.5 %
HCT: 47.4 % (ref 38.5–50.0)
Hemoglobin: 16.4 g/dL (ref 13.2–17.1)
MCH: 30.8 pg (ref 27.0–33.0)
MCHC: 34.6 g/dL (ref 32.0–36.0)
MCV: 88.9 fL (ref 80.0–100.0)
MPV: 10.3 fL (ref 7.5–12.5)
Monocytes Relative: 8.4 %
Neutro Abs: 1905 {cells}/uL (ref 1500–7800)
Neutrophils Relative %: 44.3 %
Platelets: 299 Thousand/uL (ref 140–400)
RBC: 5.33 Million/uL (ref 4.20–5.80)
RDW: 12.7 % (ref 11.0–15.0)
Total Lymphocyte: 45.9 %
WBC: 4.3 Thousand/uL (ref 3.8–10.8)

## 2024-07-09 LAB — HIV ANTIBODY (ROUTINE TESTING W REFLEX)
HIV 1&2 Ab, 4th Generation: NONREACTIVE
HIV FINAL INTERPRETATION: NEGATIVE

## 2024-07-09 LAB — HEMOGLOBIN A1C
Hgb A1c MFr Bld: 11.4 % — ABNORMAL HIGH (ref <5.7)
Mean Plasma Glucose: 280 mg/dL
eAG (mmol/L): 15.5 mmol/L

## 2024-07-09 LAB — MICROALBUMIN / CREATININE URINE RATIO
Creatinine, Urine: 225 mg/dL (ref 20–320)
Microalb Creat Ratio: 40 mg/g{creat} — ABNORMAL HIGH (ref <30)
Microalb, Ur: 9.1 mg/dL

## 2024-07-12 ENCOUNTER — Other Ambulatory Visit: Payer: Self-pay | Admitting: Internal Medicine

## 2024-07-12 ENCOUNTER — Telehealth: Payer: Self-pay

## 2024-07-12 DIAGNOSIS — E782 Mixed hyperlipidemia: Secondary | ICD-10-CM

## 2024-07-12 MED ORDER — ROSUVASTATIN CALCIUM 10 MG PO TABS: 10.0000 mg | ORAL_TABLET | Freq: Every day | ORAL | 3 refills | Status: AC

## 2024-07-12 NOTE — Telephone Encounter (Signed)
   07/12/2024  Kevin Morris  DOB: 06/16/1981 MRN: [id number]  Patient was identified as falling into the [place] Measure - Diabetes.   Left a voicemail to attempt to schedule with the clinical pharmacist.   First attempt for patient outreach.   Daejah Klebba E. Marsh, PharmD Clinical Pharmacist [facility] [phone]

## 2024-07-19 ENCOUNTER — Other Ambulatory Visit (INDEPENDENT_AMBULATORY_CARE_PROVIDER_SITE_OTHER)

## 2024-07-19 DIAGNOSIS — Z7985 Long-term (current) use of injectable non-insulin antidiabetic drugs: Secondary | ICD-10-CM

## 2024-07-19 DIAGNOSIS — E1165 Type 2 diabetes mellitus with hyperglycemia: Secondary | ICD-10-CM

## 2024-07-19 DIAGNOSIS — Z794 Long term (current) use of insulin: Secondary | ICD-10-CM

## 2024-07-19 MED ORDER — DEXCOM G7 SENSOR MISC: 5 refills | Status: AC

## 2024-07-19 MED ORDER — LANCET DEVICE MISC: 0 refills | Status: AC

## 2024-07-19 MED ORDER — TIRZEPATIDE 5 MG/0.5ML ~~LOC~~ SOAJ: 5.0000 mg | SUBCUTANEOUS | 1 refills | Status: DC

## 2024-07-19 MED ORDER — BLOOD GLUCOSE MONITORING SUPPL DEVI: 0 refills | Status: AC

## 2024-07-19 MED ORDER — BLOOD GLUCOSE TEST VI STRP: ORAL_STRIP | 3 refills | Status: AC

## 2024-07-19 MED ORDER — LANCETS MISC. MISC: 3 refills | Status: AC

## 2024-07-19 NOTE — Progress Notes (Signed)
 S:     Chief Complaint  Patient presents with   Medication Management    Reason for visit: ?  Kevin Morris is a 42 y.o. male with a history of diabetes (type 2), who presents today for an initial diabetes Telephone pharmacotherapy visit.? Pertinent PMH also includes obesity, HTN, HLD.  Care Team: Primary Care Provider: Bernardo Fend, DO  At last visit with PCP on 07/02/24, A1c remained elevated at 11.4%, but improved from 14%. Patient was started on Mounjaro  2.5 mg weekly at that time. LDL was elevated at 198 mg/dL and patient was started on rosuvastatin 10 mg daily.   Patient reports taking his third dose of Mounjaro  this week without issues. He reports previously using Dexcom CGM, but discontinued d/t the frequent high alarms.   Current diabetes medications include: Lantus 20 units daily, Mounjaro  2.5 mg weekly (Monday) Previous diabetes medications include: metformin , Farxiga   Current hypertension medications include: carvedilol  6.25 mg BID, olmesartan /hydrochlorothiazide  40/12.5 mg daily Current hyperlipidemia medications include: rosuvastatin 10 mg daily  Patient reports adherence to taking all medications as prescribed.   Have you been experiencing any side effects to the medications prescribed? no Do you have any problems obtaining medications due to transportation or finances? no Insurance coverage: BCBS  Patient denies hypoglycemic events.  Reported home fasting blood sugars: 120-140 mg/dL, however, he has not been checking in the past few days d/t running out of test strips.  Patient reports improvement in nocturia (nighttime urination).  Patient reports neuropathy (nerve pain). Patient denies visual changes. Patient reports self foot exams.   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  History of chronic kidney disease? no History of albuminuria? yes, last UACR on 07/02/24 = 40 mg/g Last eye exam: 06/2024   Tobacco Use:  Tobacco Use: Medium Risk (07/02/2024)   Patient History    Smoking Tobacco Use: Former    Smokeless Tobacco Use: Unknown    Passive Exposure: Past   O:  Vitals:  Wt Readings from Last 3 Encounters:  07/02/24 (!) 309 lb (140.2 kg)  06/12/23 294 lb 12.8 oz (133.7 kg)  06/02/23 298 lb 4.8 oz (135.3 kg)   BP Readings from Last 3 Encounters:  07/02/24 122/84  06/12/23 122/78  06/02/23 130/74   Pulse Readings from Last 3 Encounters:  07/02/24 81  06/12/23 78  06/02/23 95     Labs:?  Lab Results  Component Value Date   HGBA1C 11.4 (H) 07/02/2024   HGBA1C 14.0 (A) 06/02/2023   HGBA1C 5.5 08/29/2021   GLUCOSE 244 (H) 07/02/2024   MICRALBCREAT 40 (H) 07/02/2024   MICRALBCREAT NOTE 06/02/2023   CREATININE 0.92 07/02/2024   CREATININE 1.28 (H) 05/30/2023   CREATININE 1.30 (H) 05/29/2023    Lab Results  Component Value Date   CHOL 285 (H) 07/02/2024   LDLCALC 198 (H) 07/02/2024   LDLCALC  05/29/2023     Comment:     . LDL cholesterol not calculated. Triglyceride levels greater than 400 mg/dL invalidate calculated LDL results. . Reference range: <100 . Desirable range <100 mg/dL for primary prevention;   <70 mg/dL for patients with CHD or diabetic patients  with > or = 2 CHD risk factors. SABRA LDL-C  is now calculated using the Martin-Hopkins  calculation, which is a validated novel method providing  better accuracy than the Friedewald equation in the  estimation of LDL-C.  Gladis APPLETHWAITE et al. SANDREA. 7986;689(80): 2061-2068  (http://education.[facility].com/faq/FAQ164)    LDLCALC 117 (H) 03/19/2022   HDL 48 07/02/2024   TRIG 201 (H) 07/02/2024   TRIG 413 (H)  05/29/2023   TRIG 183 (H) 03/19/2022   ALT 35 07/02/2024   ALT 29 05/30/2023   AST 24 07/02/2024   AST 19 05/30/2023      Chemistry      Component Value Date/Time   NA 137 07/02/2024 0916   NA 137 03/19/2022 0854   K 4.4 07/02/2024 0916   CL 98 07/02/2024 0916   CO2 30 07/02/2024 0916   BUN 11 07/02/2024 0916   BUN 12 03/19/2022 0854   CREATININE 0.92 07/02/2024 0916      Component Value Date/Time   CALCIUM 10.2 07/02/2024 0916   ALKPHOS 107 05/30/2023 2057   AST 24 07/02/2024 0916   ALT 35 07/02/2024 0916   BILITOT 1.0 07/02/2024 0916   BILITOT 0.5 03/19/2022 0854       The 10-year ASCVD risk score (Arnett DK, et al., 2019) is: 11.7%  Lab Results  Component Value Date   MICRALBCREAT 40 (H) 07/02/2024   MICRALBCREAT NOTE 06/02/2023    A/P: Diabetes currently uncontrolled with a most recent A1c of 11.4% on 07/02/24, which is down from 14% on 07/02/24. Patient is able to verbalize appropriate hypoglycemia management plan. Reported home fasting BG readings are close to goal with initiation of Mounjaro , however, he has not been checking in the past week as he ran out of testing supplies. Medication adherence appears appropriate. Has taken 3 doses of Mounjaro  2.5 mg - will increase dose at next fill.  Patient would like to discuss restarting CGM for BG monitoring at next visit.  -Continued basal insulin  Lantus (insulin  glargine)  20 units daily.  -Increased dose of GLP-1 Mounjaro  (tirzepatide )  to 5 mg weekly starting 08/02/24 -Patient educated on purpose, proper use, and potential adverse effects of Mounjaro .  -Extensively discussed pathophysiology of diabetes, recommended lifestyle interventions, dietary effects on blood sugar control.  -Counseled on s/sx of and management of hypoglycemia.  -Next A1c anticipated 09/2024.   ASCVD risk - primary prevention in patient with diabetes. Last LDL is 198 mg/dL, not at goal of <29 mg/dL. May discuss dose increase of statin therapy  following next lipid panel -Continued rosuvastatin 10 mg daily.   Patient verbalized understanding of treatment plan. Total time patient counseling 30 minutes.  Follow-up:  Pharmacist on 08/23/24 PCP clinic visit on 09/01/24  Peyton CHARLENA Ferries, PharmD Clinical Pharmacist [facility] [phone]

## 2024-08-05 ENCOUNTER — Telehealth: Payer: Self-pay

## 2024-08-05 NOTE — Telephone Encounter (Signed)
 Patient was identified as falling into the [place] Measure - Diabetes.   Patient was: Appointment already scheduled for:  09/01/24. And with pharmacist on 08/23/24

## 2024-08-23 ENCOUNTER — Other Ambulatory Visit (INDEPENDENT_AMBULATORY_CARE_PROVIDER_SITE_OTHER)

## 2024-08-23 DIAGNOSIS — Z794 Long term (current) use of insulin: Secondary | ICD-10-CM

## 2024-08-23 DIAGNOSIS — E1165 Type 2 diabetes mellitus with hyperglycemia: Secondary | ICD-10-CM

## 2024-08-23 MED ORDER — TIRZEPATIDE 7.5 MG/0.5ML ~~LOC~~ SOAJ: 7.5000 mg | SUBCUTANEOUS | 1 refills | Status: DC

## 2024-08-23 NOTE — Progress Notes (Signed)
 S:     Reason for visit: ?  Kevin Morris is a 43 y.o. male with a history of diabetes (type 2), who presents today for a follow up diabetes Telephone pharmacotherapy visit.? Pertinent PMH also includes obesity, HTN, HLD.  Care Team: Primary Care Provider: Bernardo Fend, DO  At last visit with PCP on 07/02/24, A1c remained elevated at 11.4%, but improved from 14%. Patient was started on Mounjaro  2.5 mg weekly at that time. LDL was elevated at 198 mg/dL and patient was started on rosuvastatin 10 mg daily.   At last visit with clinical pharmacist on 07/19/24, patient reported previously using Dexcom CGM, but discontinued d/t the frequent high alarms. Mounjaro  was increased from 2.5 mg to 5 mg weekly.   Today, he reports some bloating with Mounjaro .   Current diabetes medications include: Lantus 20 units daily, Mounjaro  5 mg weekly (Monday) Previous diabetes medications include: metformin , Farxiga   Current hypertension medications include: carvedilol  6.25 mg BID, olmesartan /hydrochlorothiazide  40/12.5 mg daily Current hyperlipidemia medications include: rosuvastatin 10 mg daily  Patient reports adherence to taking all medications as prescribed.   Have you been experiencing any side effects to the medications prescribed? Yes - some bloating with Mounjaro  Do you have any problems obtaining medications due to transportation or finances? no Insurance coverage: BCBS  Patient denies hypoglycemic events.  Reported home fasting blood sugars: 230-280 mg/dL  Patient reports improvement in nocturia (nighttime urination).  Patient reports neuropathy (nerve pain). Patient denies visual changes. Patient reports self foot exams.   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  History of chronic kidney disease? no History of albuminuria? yes, last UACR on 07/02/24 = 40 mg/g Last eye exam: 06/2024   Tobacco Use:  Tobacco Use: Medium Risk (07/02/2024)   Patient History    Smoking Tobacco Use: Former    Smokeless Tobacco Use: Unknown    Passive Exposure: Past   O:  Dexcom Clarity: *has not been wearing in 2 weeks*    Vitals:  Wt Readings from Last 3 Encounters:  07/02/24 (!) 309 lb (140.2 kg)  06/12/23 294 lb 12.8 oz (133.7 kg)  06/02/23 298 lb 4.8 oz (135.3 kg)   BP Readings from Last 3 Encounters:  07/02/24 122/84  06/12/23 122/78  06/02/23 130/74   Pulse Readings from Last 3 Encounters:  07/02/24 81  06/12/23 78  06/02/23 95     Labs:?  Lab Results  Component Value Date   HGBA1C 11.4 (H) 07/02/2024   HGBA1C 14.0 (A) 06/02/2023   HGBA1C 5.5 08/29/2021   GLUCOSE 244 (H) 07/02/2024   MICRALBCREAT 40 (H) 07/02/2024   MICRALBCREAT NOTE 06/02/2023   CREATININE 0.92 07/02/2024   CREATININE 1.28 (H) 05/30/2023   CREATININE 1.30 (H) 05/29/2023    Lab Results  Component Value Date   CHOL 285 (H) 07/02/2024   LDLCALC 198 (H) 07/02/2024   LDLCALC  05/29/2023     Comment:     . LDL cholesterol not calculated. Triglyceride levels greater than 400 mg/dL invalidate calculated LDL results. . Reference range: <100 . Desirable range <100 mg/dL for primary prevention;   <70 mg/dL for patients with CHD or diabetic patients  with > or = 2 CHD risk  factors. SABRA LDL-C is now calculated using the Martin-Hopkins  calculation, which is a validated novel method providing  better accuracy than the Friedewald equation in the  estimation of LDL-C.  Gladis APPLETHWAITE et al. SANDREA. 7986;689(80): 2061-2068  (http://education.[facility].com/faq/FAQ164)    LDLCALC 117 (H) 03/19/2022   HDL 48 07/02/2024   TRIG 201 (H) 07/02/2024    TRIG 413 (H) 05/29/2023   TRIG 183 (H) 03/19/2022   ALT 35 07/02/2024   ALT 29 05/30/2023   AST 24 07/02/2024   AST 19 05/30/2023      Chemistry      Component Value Date/Time   NA 137 07/02/2024 0916   NA 137 03/19/2022 0854   K 4.4 07/02/2024 0916   CL 98 07/02/2024 0916   CO2 30 07/02/2024 0916   BUN 11 07/02/2024 0916   BUN 12 03/19/2022 0854   CREATININE 0.92 07/02/2024 0916      Component Value Date/Time   CALCIUM 10.2 07/02/2024 0916   ALKPHOS 107 05/30/2023 2057   AST 24 07/02/2024 0916   ALT 35 07/02/2024 0916   BILITOT 1.0 07/02/2024 0916   BILITOT 0.5 03/19/2022 0854       The 10-year ASCVD risk score (Arnett DK, et al., 2019) is: 11.7%  Lab Results  Component Value Date   MICRALBCREAT 40 (H) 07/02/2024   MICRALBCREAT NOTE 06/02/2023    A/P: Diabetes currently uncontrolled with a most recent A1c of 11.4% on 07/02/24, which is down from 14% on 07/02/24. Patient is able to verbalize appropriate hypoglycemia management plan. Reported home fasting BG readings are above goal at 230-280 mg/dL. Medication adherence appears appropriate. Discussed option of increasing GLP/GIP or insulin  therapy, given c/o bloating. Patient opted to increase Mounjaro  at this time.  -Continued basal insulin  Lantus (insulin  glargine)  20 units daily.  -Increased dose of GLP-1 Mounjaro  (tirzepatide )  to 7.5 mg weekly  -Patient educated on purpose, proper use, and potential adverse effects of Mounjaro .  -Extensively discussed pathophysiology of diabetes, recommended lifestyle interventions, dietary effects on blood sugar control.  -Counseled on s/sx of and management of hypoglycemia.  -Next A1c anticipated 09/2024.   ASCVD risk - primary prevention in patient with diabetes. Last LDL is 198 mg/dL, not at goal of <29 mg/dL. May discuss dose increase of statin therapy following next lipid panel -Continued rosuvastatin 10 mg daily.   Patient verbalized understanding of treatment plan.  Total time patient counseling 30 minutes.  Follow-up:  Pharmacist on 09/21/24 PCP clinic visit on 09/01/24  Peyton CHARLENA Ferries, PharmD Clinical Pharmacist [facility] [phone]

## 2024-08-28 ENCOUNTER — Other Ambulatory Visit: Payer: Self-pay | Admitting: Internal Medicine

## 2024-08-28 DIAGNOSIS — E1165 Type 2 diabetes mellitus with hyperglycemia: Secondary | ICD-10-CM

## 2024-08-30 NOTE — Telephone Encounter (Signed)
 Requested medication (s) are due for refill today: PA needed  Requested medication (s) are on the active medication list: yes  Last refill:  08/23/24  Future visit scheduled: yes  Notes to clinic:    Pharmacy comment: Alternative Requested:NEEDS PA.       Requested Prescriptions  Pending Prescriptions Disp Refills   MOUNJARO  7.5 MG/0.5ML Pen [Pharmacy Med Name: MOUNJARO  7.5 MG/0.5 ML PEN]  1    Sig: INJECT 7.5 MG SUBCUTANEOUSLY WEEKLY     Off-Protocol Failed - 08/30/2024  2:49 PM      Failed - Medication not assigned to a protocol, review manually.      Passed - Valid encounter within last 12 months    Recent Outpatient Visits           1 month ago Type 2 diabetes mellitus with hyperglycemia, without long-term current use of insulin  [facility])   [facility] Bernardo Fend, [place]

## 2024-08-31 ENCOUNTER — Other Ambulatory Visit (HOSPITAL_COMMUNITY): Payer: Self-pay

## 2024-08-31 NOTE — Telephone Encounter (Signed)
 Good morning Kevin Morris, we are unable to locate prescription coverage for Mr. Vecchio to send his PA, I did see his medical card but not his prescription card information.

## 2024-09-01 ENCOUNTER — Encounter: Payer: Self-pay | Admitting: Internal Medicine

## 2024-09-01 ENCOUNTER — Ambulatory Visit: Admitting: Internal Medicine

## 2024-09-01 ENCOUNTER — Other Ambulatory Visit: Payer: Self-pay

## 2024-09-01 VITALS — BP 122/84 | HR 100 | Temp 98.2°F | Resp 16 | Ht 76.0 in | Wt 324.0 lb

## 2024-09-01 DIAGNOSIS — I5032 Chronic diastolic (congestive) heart failure: Secondary | ICD-10-CM

## 2024-09-01 DIAGNOSIS — E669 Obesity, unspecified: Secondary | ICD-10-CM

## 2024-09-01 DIAGNOSIS — R635 Abnormal weight gain: Secondary | ICD-10-CM | POA: Diagnosis not present

## 2024-09-01 DIAGNOSIS — E1165 Type 2 diabetes mellitus with hyperglycemia: Secondary | ICD-10-CM

## 2024-09-01 DIAGNOSIS — R140 Abdominal distension (gaseous): Secondary | ICD-10-CM

## 2024-09-01 DIAGNOSIS — I1A0 Resistant hypertension: Secondary | ICD-10-CM

## 2024-09-01 DIAGNOSIS — Z794 Long term (current) use of insulin: Secondary | ICD-10-CM

## 2024-09-01 MED ORDER — INSULIN GLARGINE 100 UNIT/ML ~~LOC~~ SOLN: 20.0000 [IU] | Freq: Every day | SUBCUTANEOUS | 4 refills | Status: DC

## 2024-09-01 NOTE — Progress Notes (Signed)
 Established Patient Office Visit  Subjective:  Patient ID: Kevin Morris, male    DOB: [birth year]  Age: 43 y.o. MRN: [id number]  CC:  Chief Complaint  Patient presents with   Medical Management of Chronic Issues    2 month recheck    HPI [name] presents for follow up on chronic medical conditions.   Discussed the use of AI scribe software for clinical note transcription with the patient, who gave verbal consent to proceed.  History of Present Illness  Kevin Morris is a 43 year old male with heart failure and diabetes who presents with abdominal swelling and weight gain.  He reports progressive abdominal swelling and weight gain, with his abdomen feeling hard and swollen but not painful. His weight has increased from 309 pounds at his last visit to 324 pounds. He is unsure if this is fluid and is concerned about gaining weight despite starting Mounjaro , which was increased to 5 mg and is planned to increase to 7.5 mg.  He has heart failure and is not followed by cardiology. His last echocardiogram was in 2022.  For diabetes, his fasting blood sugars are usually 240 to 280 mg/dL, improving to 869 to 819 mg/dL after exercise. He uses 20 units of insulin  and Mounjaro  for glucose control.  He notes foot discomfort with some mild numbness and tingling, without prior significant foot problems.  He has regular bowel movements without diarrhea or blood in the stool currently. He reports normal urination and feels he empties his bladder completely.   Diabetes, Type 2: -Last A1c 11.4 9/25 -Medications: Lantus  20 units at night, now on Mounjaro  5 mg (has rx for 7.5 mg but still waiting on prior auth) -Patient is compliant with the above medications and reports no side effects.  -Diet: working on increasing protein intake -Blood sugar: fasting 280-300, will go down to 130ish after work out -Exercise: trying to work out at [employer] but very fatigued, this is slowly  improving -Eye exam: UTD -Foot exam: Due -Microalbumin: UTD -Statin: No -PNA vaccine: Will discuss at follow up -Denies symptoms of hypoglycemia, polyuria, polydipsia, but does have numbness and tingling in the bottoms of his feet.   Hypertension: -No longer following with HTN clinic -Medications: HCTZ-Olmesartan  25-40 mg, Coreg  6.25 BID  -Checking BP at home (average): not checking -Denies any chest pain, SOB, vision changes, LE edema or symptoms of hypotension, headaches improving.   HLD: -Medications: Crestor  10 mg -Patient is compliant with above medications and reports no side effects.  -Last lipid panel: Lipid Panel     Component Value Date/Time   CHOL 285 (H) 07/02/2024 0916   CHOL 191 03/19/2022 0854   TRIG 201 (H) 07/02/2024 0916   HDL 48 07/02/2024 0916   HDL 42 03/19/2022 0854   CHOLHDL 5.9 (H) 07/02/2024 0916   LDLCALC 198 (H) 07/02/2024 0916   LABVLDL 32 03/19/2022 0854    HFpEF: -Last echo 08/30/21 - EF 55-60% without wall motion abnormalities or valvular pathology and LVH -No longer following up with Cardiology   GERD: -Currently on Pantoprazole  20 mg    Past Medical History:  Diagnosis Date   Apnea 11/06/2021   CHF (congestive heart failure) (HCC)    Generalized headaches    GERD (gastroesophageal reflux disease)    Hypertension     Past Surgical History:  Procedure Laterality Date   ACHILLES TENDON SURGERY Right    CONDYLOMA EXCISION/FULGURATION N/A 06/05/2022   Procedure: CONDYLOMA REMOVAL, perianal;  Surgeon: Lane Shope,  MD;  Location: [facility] ORS;  Service: General;  Laterality: N/A;   KNEE ARTHROSCOPY Bilateral    ORIF FINGER / THUMB FRACTURE Bilateral    PILONIDAL CYST EXCISION     SHOULDER ARTHROSCOPY WITH LABRAL REPAIR Right     Family History  Problem Relation Age of Onset   Hypertension Mother    Cancer Maternal Grandmother        breast   Diabetes Maternal Grandmother    Stroke Maternal Grandmother     Social History    Socioeconomic History   Marital status: Single    Spouse name: Not on file   Number of children: Not on file   Years of education: Not on file   Highest education level: Not on file  Occupational History   Not on file  Tobacco Use   Smoking status: Former    Types: Cigars    Passive exposure: Past   Smokeless tobacco: Not on file   Tobacco comments:    Very occasional cigar use  Vaping Use   Vaping status: Never Used  Substance and Sexual Activity   Alcohol use: Yes    Comment: occassional   Drug use: Never   Sexual activity: Yes  Other Topics Concern   Not on file  Social History Narrative   Not on file   Social Drivers of Health   Financial Resource Strain: Low Risk (12/07/2023)   Received from [facility]   Overall Financial Resource Strain (CARDIA)    Difficulty of Paying Living Expenses: Not hard at all  Food Insecurity: No Food Insecurity (12/07/2023)   Received from [facility]   Hunger Vital Sign    Within the past 12 months, you worried that your food would run out before you got the money to buy more.: Never true    Within the past 12 months, the food you bought just didn't last and you didn't have money to get more.: Never true  Transportation Needs: No Transportation Needs (12/07/2023)   Received from [facility]   PRAPARE - Transportation    Lack of Transportation (Medical): No    Lack of Transportation (Non-Medical): No  Physical Activity: Insufficiently Active (12/07/2023)   Received from [facility]   Exercise Vital Sign    On average, how many days per week do you engage in moderate to strenuous exercise (like a brisk walk)?: 2 days    On average, how many minutes do you engage in exercise at this level?: 20 min  Stress: Stress Concern Present (12/07/2023)   Received from [facility] - Occupational Stress Questionnaire    Feeling of Stress : Rather much  Social Connections: Socially Isolated  (12/07/2023)   Received from [facility]   Social Connection and Isolation Panel    In a typical week, how many times do you talk on the phone with family, friends, or neighbors?: More than three times a week    How often do you get together with friends or relatives?: More than three times a week    How often do you attend church or religious services?: Never    Do you belong to any clubs or organizations such as church groups, unions, fraternal or athletic groups, or school groups?: No    How often do you attend meetings of the clubs or organizations you belong to?: Never    Are you married, widowed, divorced, separated, never married, or living  with a partner?: Never married  Intimate Partner Violence: Not At Risk (12/07/2023)   Received from [facility]   Humiliation, Afraid, Rape, and Kick questionnaire    Within the last year, have you been afraid of your partner or ex-partner?: No    Within the last year, have you been humiliated or emotionally abused in other ways by your partner or ex-partner?: No    Within the last year, have you been kicked, hit, slapped, or otherwise physically hurt by your partner or ex-partner?: No    Within the last year, have you been raped or forced to have any kind of sexual activity by your partner or ex-partner?: No    Outpatient Medications Prior to Visit  Medication Sig Dispense Refill   carvedilol  (COREG ) 6.25 MG tablet Take 1 tablet (6.25 mg total) by mouth 2 (two) times daily. 180 tablet 1   insulin  glargine (LANTUS ) 100 UNIT/ML injection Inject 20 Units into the skin daily.     olmesartan -hydrochlorothiazide  (BENICAR  HCT) 40-12.5 MG tablet Take 1 tablet by mouth daily. 90 tablet 1   pantoprazole  (PROTONIX ) 20 MG tablet Take 1 tablet (20 mg total) by mouth daily. 90 tablet 1   rosuvastatin  (CRESTOR ) 10 MG tablet Take 1 tablet (10 mg total) by mouth daily. 90 tablet 3   tirzepatide  (MOUNJARO ) 7.5 MG/0.5ML Pen Inject 7.5 mg into the skin once a  week. 2 mL 1   Blood Glucose Monitoring Suppl DEVI Use to check blood sugar up to 5 times a day. May substitute to any manufacturer covered by patient's insurance. 1 each 0   Continuous Glucose Sensor (DEXCOM G7 SENSOR) MISC Use to check blood sugar. Change every 10 days 3 each 5   Glucose Blood (BLOOD GLUCOSE TEST STRIPS) STRP Use to check blood sugar up to 5 times a day. May substitute to any manufacturer covered by patient's insurance. 200 strip 3   Lancet Device MISC Use to check blood sugar up to 5 times a day.May substitute to any manufacturer covered by patient's insurance. 1 each 0   Lancets Misc. MISC Use to check blood sugar up to 5 times a day.May substitute to any manufacturer covered by patient's insurance. 200 each 3   tiZANidine  (ZANAFLEX ) 4 MG tablet Take 1 tablet (4 mg total) by mouth at bedtime as needed for muscle spasms. (Patient not taking: Reported on 08/23/2024)     No facility-administered medications prior to visit.    Allergies  Allergen Reactions   Iodinated Contrast Media Anaphylaxis   Shellfish Protein-Containing Drug Products Anaphylaxis    ROS Review of Systems  Respiratory:  Negative for cough and shortness of breath.   Cardiovascular:  Negative for chest pain, palpitations and leg swelling.  Gastrointestinal:  Positive for abdominal distention. Negative for abdominal pain, constipation, diarrhea and nausea.  Genitourinary:  Negative for dysuria, frequency and urgency.  All other systems reviewed and are negative.     Objective:    Physical Exam Constitutional:      Appearance: Normal appearance.  HENT:     Head: Normocephalic and atraumatic.  Eyes:     Conjunctiva/sclera: Conjunctivae normal.  Cardiovascular:     Rate and Rhythm: Normal rate and regular rhythm.     Pulses:          Dorsalis pedis pulses are 2+ on the right side and 2+ on the left side.  Pulmonary:     Effort: Pulmonary effort is normal.     Breath sounds: Normal breath  sounds.  Abdominal:     General: Abdomen is flat. There is distension.     Palpations: Abdomen is soft.     Tenderness: There is no abdominal tenderness.  Musculoskeletal:     Right lower leg: No edema.     Left lower leg: No edema.     Right foot: Normal range of motion. No deformity, bunion, Charcot foot, foot drop or prominent metatarsal heads.     Left foot: Normal range of motion. No deformity, bunion, Charcot foot, foot drop or prominent metatarsal heads.  Feet:     Right foot:     Protective Sensation: 6 sites tested.  6 sites sensed.     Skin integrity: Skin integrity normal.     Toenail Condition: Right toenails are abnormally thick.     Left foot:     Protective Sensation: 6 sites tested.  6 sites sensed.     Skin integrity: Skin integrity normal.     Toenail Condition: Left toenails are abnormally thick.  Skin:    General: Skin is warm and dry.  Neurological:     General: No focal deficit present.     Mental Status: He is alert. Mental status is at baseline.  Psychiatric:        Mood and Affect: Mood normal.        Behavior: Behavior normal.     BP 122/84 (Cuff Size: Large)   Pulse 100   Temp 98.2 F (36.8 C) (Oral)   Resp 16   Ht 6' 4 (1.93 m)   Wt (!) 324 lb (147 kg)   SpO2 98%   BMI 39.44 kg/m  Wt Readings from Last 3 Encounters:  09/01/24 (!) 324 lb (147 kg)  07/02/24 (!) 309 lb (140.2 kg)  06/12/23 294 lb 12.8 oz (133.7 kg)   Vitals:   09/01/24 0839  BP: 122/84       Health Maintenance Due  Topic Date Due   OPHTHALMOLOGY EXAM  Never done   FOOT EXAM  06/01/2024   COVID-19 Vaccine (1 - 2025-26 season) Never done    There are no preventive care reminders to display for this patient.  Lab Results  Component Value Date   TSH 2.219 08/29/2021   Lab Results  Component Value Date   WBC 4.3 07/02/2024   HGB 16.4 07/02/2024   HCT 47.4 07/02/2024   MCV 88.9 07/02/2024   PLT 299 07/02/2024   Lab Results  Component Value Date   NA 137  07/02/2024   K 4.4 07/02/2024   CO2 30 07/02/2024   GLUCOSE 244 (H) 07/02/2024   BUN 11 07/02/2024   CREATININE 0.92 07/02/2024   BILITOT 1.0 07/02/2024   ALKPHOS 107 05/30/2023   AST 24 07/02/2024   ALT 35 07/02/2024   PROT 7.8 07/02/2024   ALBUMIN 3.9 05/30/2023   CALCIUM  10.2 07/02/2024   ANIONGAP 11 05/30/2023   EGFR 106 07/02/2024   Lab Results  Component Value Date   CHOL 285 (H) 07/02/2024   Lab Results  Component Value Date   HDL 48 07/02/2024   Lab Results  Component Value Date   LDLCALC 198 (H) 07/02/2024   Lab Results  Component Value Date   TRIG 201 (H) 07/02/2024   Lab Results  Component Value Date   CHOLHDL 5.9 (H) 07/02/2024   Lab Results  Component Value Date   HGBA1C 11.4 (H) 07/02/2024      Assessment & Plan:   Assessment & Plan  Type  2 diabetes mellitus Mounjaro  improved A1c. Fasting glucose elevated, decreases post-exercise. No abdominal pain, but pancreatitis risk due to Mounjaro . Insulin  unchanged pending Mounjaro  dose increase. - Continue insulin  at 20 units. - Ordered lipase level for pancreatitis evaluation. - Continue Mounjaro  at current dose pending prior authorization for increased dose. - Refilled insulin  prescription, sent to [facility] on [address].  Heart failure Last echocardiogram in 2022. No fluid overload symptoms. Abdominal swelling not consistent with fluid retention. - Ordered echocardiogram to assess heart function.  Hypertension Blood pressure stable here today, no changes made to medications and appropriate refills sent to pharmacy.   Obesity Weight gain despite Mounjaro . Current weight 324 lbs, up from 309 lbs. Mounjaro  expected to aid weight loss, dose increase pending. - Continue Mounjaro  at current dose pending prior authorization for increased dose.  Abdominal swelling, under evaluation Swelling with hard, rounded appearance. No pain or fluid retention signs. Differential includes medication side effects  or other pathology. No immediate pancreatitis or fluid overload signs. - Ordered lipase level for pancreatitis evaluation. - If labs normal, consider CT scan of abdomen.  - insulin  glargine (LANTUS ) 100 UNIT/ML injection; Inject 0.2 mLs (20 Units total) into the skin daily.  Dispense: 10 mL; Refill: 4 - Lipase - HM Diabetes Foot Exam - Comprehensive Metabolic Panel (CMET) - Lipase - ECHOCARDIOGRAM COMPLETE; Future - TSH   Follow-up: Return in about 3 months (around 12/02/2024).    Sharyle Fischer, DO

## 2024-09-02 LAB — COMPREHENSIVE METABOLIC PANEL WITH GFR
AG Ratio: 1.8 (calc) (ref 1.0–2.5)
ALT: 43 U/L (ref 9–46)
AST: 26 U/L (ref 10–40)
Albumin: 4.9 g/dL (ref 3.6–5.1)
Alkaline phosphatase (APISO): 61 U/L (ref 36–130)
BUN: 13 mg/dL (ref 7–25)
CO2: 27 mmol/L (ref 20–32)
Calcium: 9.7 mg/dL (ref 8.6–10.3)
Chloride: 98 mmol/L (ref 98–110)
Creat: 0.99 mg/dL (ref 0.60–1.29)
Globulin: 2.7 g/dL (ref 1.9–3.7)
Glucose, Bld: 200 mg/dL — ABNORMAL HIGH (ref 65–99)
Potassium: 4.4 mmol/L (ref 3.5–5.3)
Sodium: 136 mmol/L (ref 135–146)
Total Bilirubin: 0.6 mg/dL (ref 0.2–1.2)
Total Protein: 7.6 g/dL (ref 6.1–8.1)
eGFR: 97 mL/min/1.73m2 (ref >=60)

## 2024-09-02 LAB — LIPASE: Lipase: 50 U/L (ref 7–60)

## 2024-09-02 LAB — TSH: TSH: 1.17 m[IU]/L (ref 0.40–4.50)

## 2024-09-06 ENCOUNTER — Ambulatory Visit: Payer: Self-pay | Admitting: Internal Medicine

## 2024-09-13 ENCOUNTER — Ambulatory Visit: Admission: RE | Admit: 2024-09-13 | Source: Ambulatory Visit

## 2024-09-21 ENCOUNTER — Other Ambulatory Visit (INDEPENDENT_AMBULATORY_CARE_PROVIDER_SITE_OTHER)

## 2024-09-21 ENCOUNTER — Other Ambulatory Visit: Payer: Self-pay | Admitting: Internal Medicine

## 2024-09-21 DIAGNOSIS — Z794 Long term (current) use of insulin: Secondary | ICD-10-CM

## 2024-09-21 DIAGNOSIS — E1165 Type 2 diabetes mellitus with hyperglycemia: Secondary | ICD-10-CM

## 2024-09-21 MED ORDER — PEN NEEDLES 31G X 5 MM MISC: 6 refills | Status: AC

## 2024-09-21 MED ORDER — INSULIN GLARGINE 100 UNIT/ML SOLOSTAR PEN: 20.0000 [IU] | PEN_INJECTOR | Freq: Every day | SUBCUTANEOUS | 2 refills | Status: AC

## 2024-09-21 NOTE — Progress Notes (Signed)
 S:     Reason for visit: ?  Kevin Morris is a 43 y.o. male with a history of diabetes (type 2), who presents today for a follow up diabetes Telephone pharmacotherapy visit.? Pertinent PMH also includes obesity, HTN, HLD.   Care Team: Primary Care Provider: Bernardo Fend, DO  At last visit with clinical pharmacist on 08/23/24, patient was instructed to increase to Mounjaro  7.5 mg weekly.   At follow up with PCP on 09/01/24, patient reported a delay in obtaining an updated dose from the pharmacy.  Per pharmacy rep, Mounjaro  7.5 mg weekly is now ready for pick up at $25.  Current diabetes medications include: Lantus  20 units daily, Mounjaro  5 mg weekly (Monday) Previous diabetes medications include: metformin , Farxiga   Current hypertension medications include: carvedilol  6.25 mg BID, olmesartan /hydrochlorothiazide  40/12.5 mg daily Current hyperlipidemia medications include: rosuvastatin  10 mg daily  Patient reports adherence to taking all medications as prescribed, aside from   Have you been experiencing any side effects to the medications prescribed? no Do you have any problems obtaining medications due to transportation or finances? Yes - previous issue at pharmacy level with filling Mounjaro  7.5 mg Insurance coverage: [employer]  Patient denies hypoglycemic events.  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  ACE/ARB: yes; olmesartan /hydrochlorothiazide   Last urinary albumin/creatinine ratio:  Lab Results  Component Value Date   MICRALBCREAT 40 (H)  07/02/2024   MICRALBCREAT NOTE 06/02/2023   Last eye exam:     Last foot exam: 06/02/2023 Tobacco Use:  Tobacco Use: Medium Risk (09/01/2024)   Patient History    Smoking Tobacco Use: Former    Smokeless Tobacco Use: Unknown    Passive Exposure: Past   O:  Dexcom Clarity Report   *has not been wearing Dexcom since 09/11/24 Vitals:  Wt Readings from Last 3 Encounters:  09/01/24 (!) 324 lb (147 kg)  07/02/24 (!) 309 lb (140.2 kg)  06/12/23 294 lb 12.8 oz (133.7 kg)   BP Readings from Last 3 Encounters:  09/01/24 122/84  07/02/24 122/84  06/12/23 122/78   Pulse Readings from Last 3 Encounters:  09/01/24 100  07/02/24 81  06/12/23 78     Labs:?  Lab Results  Component Value Date   HGBA1C 11.4 (H) 07/02/2024   HGBA1C 14.0 (A) 06/02/2023   HGBA1C 5.5 08/29/2021   GLUCOSE 200 (H) 09/01/2024   MICRALBCREAT 40 (H) 07/02/2024   MICRALBCREAT NOTE 06/02/2023   CREATININE 0.99 09/01/2024   CREATININE 0.92 07/02/2024   CREATININE 1.28 (H) 05/30/2023    Lab Results  Component Value Date   CHOL 285 (H) 07/02/2024   LDLCALC 198 (H) 07/02/2024   LDLCALC  05/29/2023     Comment:     . LDL cholesterol not calculated. Triglyceride levels greater than 400 mg/dL invalidate calculated LDL results. . Reference range: <100 . Desirable range <100 mg/dL for primary prevention;   <70 mg/dL for patients with CHD or diabetic patients  with > or = 2 CHD risk factors. SABRA LDL-C is now calculated using the Martin-Hopkins  calculation, which is a validated novel method providing  better accuracy than the  Friedewald equation in the  estimation of LDL-C.  Gladis APPLETHWAITE et al. SANDREA. 7986;689(80): 2061-2068  (http://education.[facility].com/faq/FAQ164)    LDLCALC 117 (H) 03/19/2022   HDL 48 07/02/2024   TRIG 201 (H) 07/02/2024   TRIG 413 (H) 05/29/2023   TRIG 183 (H) 03/19/2022   ALT 43 09/01/2024   ALT 35 07/02/2024   AST 26 09/01/2024   AST 24 07/02/2024      Chemistry       Component Value Date/Time   NA 136 09/01/2024 0915   NA 137 03/19/2022 0854   K 4.4 09/01/2024 0915   CL 98 09/01/2024 0915   CO2 27 09/01/2024 0915   BUN 13 09/01/2024 0915   BUN 12 03/19/2022 0854   CREATININE 0.99 09/01/2024 0915      Component Value Date/Time   CALCIUM  9.7 09/01/2024 0915   ALKPHOS 107 05/30/2023 2057   AST 26 09/01/2024 0915   ALT 43 09/01/2024 0915   BILITOT 0.6 09/01/2024 0915   BILITOT 0.5 03/19/2022 0854       The 10-year ASCVD risk score (Arnett DK, et al., 2019) is: 11.7%  Lab Results  Component Value Date   MICRALBCREAT 40 (H) 07/02/2024   MICRALBCREAT NOTE 06/02/2023    A/P: Diabetes currently uncontrolled with a most recent A1c of 11.4% on 07/02/24. CGM report remains elevated with a GMI of 10.3%, however, patient has not been wearing his CGM in ~10 days. It appears there was a delay in obtaining Mounjaro  7.5 d/t PA requirement. Per pharmacy, increased dose is now ready at the pharmacy currently and patient took last dose of Mounjaro  5 mg this week. Will increase Mounjaro  as previously planned. Will also send updated Rx for insulin  pens vs vials.  -Continued basal insulin  Lantus  (insulin  glargine)  20 units daily. Sent in to pharmacy as insulin  pens. -Increased dose of GLP-1 Mounjaro  (tirzepatide )  to 7.5 mg weekly as previous instructed. Informed patient this is ready at the pharmacy  -Continue monitoring BG via Dexcom G7. Encouraged regular use.  -Extensively discussed pathophysiology of diabetes, recommended lifestyle interventions, dietary effects on blood sugar control.  -Counseled on s/sx of and management of hypoglycemia.  -Next A1c anticipated 10/2024.   ASCVD risk - primary prevention in patient with diabetes. Last LDL is 198 mg/dL, not at goal of <29 mg/dL. May discuss dose increase of statin therapy following next lipid panel -Continued rosuvastatin  10 mg daily.   Patient verbalized understanding of treatment plan. Total time  patient counseling 30 minutes.  Follow-up:  Pharmacist on 10/18/24 PCP clinic visit on 12/03/24  Peyton CHARLENA Ferries, PharmD, CPP Clinical Pharmacist [facility] [phone]

## 2024-09-23 NOTE — Telephone Encounter (Signed)
 Discontinued 08/23/24, dose change.  Requested Prescriptions  Pending Prescriptions Disp Refills   MOUNJARO  5 MG/0.5ML Pen [Pharmacy Med Name: MOUNJARO  5 MG/0.5 ML PEN]  1    Sig: INJECT 5 MG SUBCUTANEOUSLY WEEKLY     Off-Protocol Failed - 09/23/2024  4:22 PM      Failed - Medication not assigned to a protocol, review manually.      Passed - Valid encounter within last 12 months    Recent Outpatient Visits           3 weeks ago Type 2 diabetes mellitus with hyperglycemia, without long-term current use of insulin  [facility])   [facility] Bernardo Fend, DO   2 months ago Type 2 diabetes mellitus with hyperglycemia, without long-term current use of insulin  [facility])   [facility] Bernardo Fend, [place]

## 2024-10-18 ENCOUNTER — Other Ambulatory Visit (INDEPENDENT_AMBULATORY_CARE_PROVIDER_SITE_OTHER)

## 2024-10-18 DIAGNOSIS — Z794 Long term (current) use of insulin: Secondary | ICD-10-CM

## 2024-10-18 DIAGNOSIS — E1165 Type 2 diabetes mellitus with hyperglycemia: Secondary | ICD-10-CM

## 2024-10-18 MED ORDER — MOUNJARO 10 MG/0.5ML ~~LOC~~ SOAJ: 10.0000 mg | SUBCUTANEOUS | 0 refills | Status: AC

## 2024-10-18 NOTE — Progress Notes (Signed)
 "  S:     Reason for visit: ?  Kevin Morris is a 44 y.o. male with a history of diabetes (type 2), who presents today for a follow up diabetes Telephone pharmacotherapy visit.? Pertinent PMH also includes obesity, HTN, HLD.   Care Team: Primary Care Provider: Bernardo Fend, DO  Today, patient reports there was a delay in obtaining Dexcom G7 refills from the pharmacy. He reports this is now ready for pick up.   Current diabetes medications include: Lantus  20 units daily, Mounjaro  7.5 mg weekly (Monday) Previous diabetes medications include: metformin , Farxiga   Current hypertension medications include: carvedilol  6.25 mg BID, olmesartan /hydrochlorothiazide  40/12.5 mg daily Current hyperlipidemia medications include: rosuvastatin  10 mg daily  Patient reports adherence to taking all medications as prescribed, aside from   Have you been experiencing any side effects to the medications prescribed? no Do you have any problems obtaining medications due to transportation or finances? No Insurance coverage: [employer]  Patient denies hypoglycemic events.  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  ACE/ARB: yes; olmesartan /hydrochlorothiazide   Last urinary albumin/creatinine ratio:  Lab Results  Component Value Date   MICRALBCREAT 40 (H) 07/02/2024   MICRALBCREAT NOTE 06/02/2023   Last eye exam:     Last foot exam: 06/02/2023 Tobacco Use:  Tobacco Use: Medium Risk (09/01/2024)   Patient History    Smoking Tobacco Use: Former    Smokeless Tobacco Use: Unknown    Passive Exposure:  Past   O:  Vitals:  Wt Readings from Last 3 Encounters:  09/01/24 (!) 324 lb (147 kg)  07/02/24 (!) 309 lb (140.2 kg)  06/12/23 294 lb 12.8 oz (133.7 kg)   BP Readings from Last 3 Encounters:  09/01/24 122/84  07/02/24 122/84  06/12/23 122/78   Pulse Readings from Last 3 Encounters:  09/01/24 100  07/02/24 81  06/12/23 78     Labs:?  Lab Results  Component Value Date   HGBA1C 11.4 (H) 07/02/2024   HGBA1C 14.0 (A) 06/02/2023   HGBA1C 5.5 08/29/2021   GLUCOSE 200 (H) 09/01/2024   MICRALBCREAT 40 (H) 07/02/2024   MICRALBCREAT NOTE 06/02/2023   CREATININE 0.99 09/01/2024   CREATININE 0.92 07/02/2024   CREATININE 1.28 (H) 05/30/2023    Lab Results  Component Value Date   CHOL 285 (H) 07/02/2024   LDLCALC 198 (H) 07/02/2024   LDLCALC  05/29/2023     Comment:     . LDL cholesterol not calculated. Triglyceride levels greater than 400 mg/dL invalidate calculated LDL results. . Reference range: <100 . Desirable range <100 mg/dL for primary prevention;   <70 mg/dL for patients with CHD or diabetic patients  with > or = 2 CHD risk factors. SABRA LDL-C is now calculated using the Martin-Hopkins  calculation, which is a validated novel method providing  better accuracy than the Friedewald equation in the  estimation of LDL-C.  Gladis APPLETHWAITE et al. SANDREA. 7986;689(80): 2061-2068  (http://education.[facility].com/faq/FAQ164)    LDLCALC 117 (H) 03/19/2022   HDL 48 07/02/2024   TRIG 201 (H) 07/02/2024   TRIG 413 (H) 05/29/2023   TRIG 183 (H) 03/19/2022   ALT 43 09/01/2024   ALT 35  07/02/2024   AST 26 09/01/2024   AST 24 07/02/2024      Chemistry      Component Value Date/Time   NA 136 09/01/2024 0915   NA 137 03/19/2022 0854   K 4.4 09/01/2024 0915   CL 98 09/01/2024 0915   CO2 27 09/01/2024 0915   BUN 13 09/01/2024 0915   BUN 12 03/19/2022 0854   CREATININE 0.99 09/01/2024 0915      Component Value Date/Time   CALCIUM  9.7 09/01/2024 0915   ALKPHOS 107  05/30/2023 2057   AST 26 09/01/2024 0915   ALT 43 09/01/2024 0915   BILITOT 0.6 09/01/2024 0915   BILITOT 0.5 03/19/2022 0854       The 10-year ASCVD risk score (Arnett DK, et al., 2019) is: 11.7%  Lab Results  Component Value Date   MICRALBCREAT 40 (H) 07/02/2024   MICRALBCREAT NOTE 06/02/2023    A/P: Diabetes currently uncontrolled with a most recent A1c of 11.4% on 07/02/24. Has not been wearing Dexcom G7 d/t reported issues filling at the pharmacy, however, this is now ready for pick up per patient. Reported random BG readings remain elevated from 160-300 mg/dL. Will plan to increase Mounjaro  as he is currently tolerating this.  -Continued basal insulin  Lantus  (insulin  glargine)  20 units daily.  -Increased dose of GLP-1 Mounjaro  (tirzepatide )  to 10 mg weekly -Continue monitoring BG via Dexcom G7. Encouraged regular use.  -Extensively discussed pathophysiology of diabetes, recommended lifestyle interventions, dietary effects on blood sugar control.  -Counseled on s/sx of and management of hypoglycemia.  -Next A1c anticipated 11/2024.   ASCVD risk - primary prevention in patient with diabetes. Last LDL is 198 mg/dL, not at goal of <29 mg/dL. May discuss dose increase of statin therapy following next lipid panel -Continued rosuvastatin  10 mg daily.   Patient verbalized understanding of treatment plan. Total time patient counseling 30 minutes.  Follow-up:  Pharmacist on 11/15/24 PCP clinic visit on 12/03/24  Peyton CHARLENA Ferries, PharmD, CPP Clinical Pharmacist [facility] [phone]   "

## 2024-10-22 ENCOUNTER — Ambulatory Visit
Admission: RE | Admit: 2024-10-22 | Discharge: 2024-10-22 | Disposition: A | Discharge status: Home / Self Care | Source: Ambulatory Visit | Attending: Internal Medicine | Admitting: Internal Medicine

## 2024-10-22 ENCOUNTER — Telehealth: Payer: Self-pay | Admitting: Pharmacy Technician

## 2024-10-22 ENCOUNTER — Encounter (HOSPITAL_COMMUNITY): Payer: Self-pay

## 2024-10-22 ENCOUNTER — Other Ambulatory Visit (HOSPITAL_COMMUNITY): Payer: Self-pay

## 2024-10-22 DIAGNOSIS — I5032 Chronic diastolic (congestive) heart failure: Secondary | ICD-10-CM | POA: Insufficient documentation

## 2024-10-22 DIAGNOSIS — I110 Hypertensive heart disease with heart failure: Secondary | ICD-10-CM | POA: Diagnosis not present

## 2024-10-22 LAB — ECHOCARDIOGRAM COMPLETE
AR max vel: 4.15 cm2
AV Area VTI: 3.9 cm2
AV Area mean vel: 3.64 cm2
AV Mean grad: 2 mmHg
AV Peak grad: 3.3 mmHg
Ao pk vel: 0.91 m/s
Area-P 1/2: 3.42 cm2
MV VTI: 3.71 cm2
S' Lateral: 3.1 cm

## 2024-10-22 NOTE — Telephone Encounter (Signed)
 Good morning, we have a PA request for Dexcom G7 Sensor and we need patient's new prescription card info. I also sent patient a MyChart message requesting the info.

## 2024-10-22 NOTE — Telephone Encounter (Signed)
 Pt aware and will send new card through MyChart.

## 2024-10-22 NOTE — Progress Notes (Signed)
*  PRELIMINARY RESULTS* Echocardiogram 2D Echocardiogram has been performed.  Floydene Harder 10/22/2024, 9:50 AM

## 2024-10-26 ENCOUNTER — Other Ambulatory Visit (HOSPITAL_COMMUNITY): Payer: Self-pay

## 2024-10-26 ENCOUNTER — Telehealth: Payer: Self-pay | Admitting: Pharmacy Technician

## 2024-10-26 NOTE — Telephone Encounter (Signed)
 Pharmacy Patient Advocate Encounter   Received notification from Pt Calls Messages that prior authorization for [facility] G7 SENSOR is required/requested.   Insurance verification completed.   The patient is insured through [employer] [facility].   Per test claim: PA required and submitted KEY/EOC/Request #: [id number] from 10/26/24 to 10/26/25. Unable to obtain price due to refill too soon rejection, last fill date 10/04/24 next available fill date01/21/26

## 2024-11-01 ENCOUNTER — Other Ambulatory Visit (HOSPITAL_COMMUNITY): Payer: Self-pay

## 2024-11-09 ENCOUNTER — Other Ambulatory Visit (HOSPITAL_COMMUNITY): Payer: Self-pay

## 2024-11-15 ENCOUNTER — Other Ambulatory Visit

## 2024-12-03 ENCOUNTER — Ambulatory Visit: Admitting: Internal Medicine
